# Patient Record
Sex: Female | Born: 1955
Health system: Southern US, Community
[De-identification: ages and names within clinical notes are randomized; demographics above are authoritative.]

## PROBLEM LIST (undated history)

## (undated) DIAGNOSIS — G4730 Sleep apnea, unspecified: Secondary | ICD-10-CM

## (undated) DIAGNOSIS — R519 Headache, unspecified: Secondary | ICD-10-CM

## (undated) DIAGNOSIS — R51 Headache: Secondary | ICD-10-CM

## (undated) DIAGNOSIS — G8929 Other chronic pain: Secondary | ICD-10-CM

## (undated) DIAGNOSIS — E785 Hyperlipidemia, unspecified: Secondary | ICD-10-CM

## (undated) DIAGNOSIS — C55 Malignant neoplasm of uterus, part unspecified: Secondary | ICD-10-CM

## (undated) HISTORY — DX: Sleep apnea, unspecified: G47.30

## (undated) HISTORY — PX: DILATION AND CURETTAGE OF UTERUS: SHX78

## (undated) HISTORY — PX: INCONTINENCE SURGERY: SHX676

## (undated) HISTORY — PX: MENISCUS REPAIR: SHX5179

## (undated) HISTORY — PX: LAPAROSCOPIC HYSTERECTOMY: SHX1926

## (undated) HISTORY — PX: EYE SURGERY: SHX253

## (undated) HISTORY — DX: Other chronic pain: G89.29

## (undated) HISTORY — DX: Headache, unspecified: R51.9

## (undated) HISTORY — DX: Hyperlipidemia, unspecified: E78.5

## (undated) HISTORY — PX: COLONOSCOPY: SHX174

## (undated) HISTORY — DX: Malignant neoplasm of uterus, part unspecified: C55

## (undated) HISTORY — PX: OTHER SURGICAL HISTORY: SHX169

## (undated) HISTORY — DX: Headache: R51

---

## 2000-04-13 ENCOUNTER — Encounter: Admission: RE | Admit: 2000-04-13 | Discharge: 2000-04-13 | Payer: Self-pay | Admitting: Family Medicine

## 2000-04-13 ENCOUNTER — Encounter: Payer: Self-pay | Admitting: Family Medicine

## 2000-04-21 ENCOUNTER — Other Ambulatory Visit: Admission: RE | Admit: 2000-04-21 | Discharge: 2000-04-21 | Payer: Self-pay | Admitting: Family Medicine

## 2001-09-27 ENCOUNTER — Other Ambulatory Visit: Admission: RE | Admit: 2001-09-27 | Discharge: 2001-09-27 | Payer: Self-pay | Admitting: Family Medicine

## 2001-10-15 ENCOUNTER — Encounter: Payer: Self-pay | Admitting: Family Medicine

## 2001-10-15 ENCOUNTER — Encounter: Admission: RE | Admit: 2001-10-15 | Discharge: 2001-10-15 | Payer: Self-pay | Admitting: Family Medicine

## 2001-12-31 ENCOUNTER — Other Ambulatory Visit: Admission: RE | Admit: 2001-12-31 | Discharge: 2001-12-31 | Payer: Self-pay | Admitting: Family Medicine

## 2002-09-15 ENCOUNTER — Other Ambulatory Visit: Admission: RE | Admit: 2002-09-15 | Discharge: 2002-09-15 | Payer: Self-pay | Admitting: Family Medicine

## 2003-03-30 ENCOUNTER — Other Ambulatory Visit: Admission: RE | Admit: 2003-03-30 | Discharge: 2003-03-30 | Payer: Self-pay | Admitting: Family Medicine

## 2003-03-31 ENCOUNTER — Encounter: Payer: Self-pay | Admitting: Family Medicine

## 2003-03-31 ENCOUNTER — Encounter: Admission: RE | Admit: 2003-03-31 | Discharge: 2003-03-31 | Payer: Self-pay | Admitting: Family Medicine

## 2004-04-16 ENCOUNTER — Other Ambulatory Visit: Admission: RE | Admit: 2004-04-16 | Discharge: 2004-04-16 | Payer: Self-pay | Admitting: Family Medicine

## 2004-04-23 ENCOUNTER — Encounter: Admission: RE | Admit: 2004-04-23 | Discharge: 2004-04-23 | Payer: Self-pay | Admitting: Family Medicine

## 2004-05-06 ENCOUNTER — Encounter: Admission: RE | Admit: 2004-05-06 | Discharge: 2004-05-06 | Payer: Self-pay | Admitting: Family Medicine

## 2005-02-07 ENCOUNTER — Ambulatory Visit: Payer: Self-pay | Admitting: Family Medicine

## 2005-02-07 ENCOUNTER — Other Ambulatory Visit: Admission: RE | Admit: 2005-02-07 | Discharge: 2005-02-07 | Payer: Self-pay | Admitting: Family Medicine

## 2005-05-13 ENCOUNTER — Ambulatory Visit (HOSPITAL_COMMUNITY): Admission: RE | Admit: 2005-05-13 | Discharge: 2005-05-13 | Payer: Self-pay | Admitting: Family Medicine

## 2005-05-13 ENCOUNTER — Ambulatory Visit: Payer: Self-pay | Admitting: Family Medicine

## 2006-04-07 ENCOUNTER — Ambulatory Visit: Payer: Self-pay | Admitting: Family Medicine

## 2006-04-30 ENCOUNTER — Ambulatory Visit: Payer: Self-pay | Admitting: Family Medicine

## 2006-05-29 ENCOUNTER — Ambulatory Visit (HOSPITAL_COMMUNITY): Admission: RE | Admit: 2006-05-29 | Discharge: 2006-05-29 | Payer: Self-pay | Admitting: Family Medicine

## 2006-09-18 ENCOUNTER — Encounter (INDEPENDENT_AMBULATORY_CARE_PROVIDER_SITE_OTHER): Payer: Self-pay | Admitting: *Deleted

## 2006-09-18 ENCOUNTER — Encounter: Payer: Self-pay | Admitting: Family Medicine

## 2006-09-18 ENCOUNTER — Other Ambulatory Visit: Admission: RE | Admit: 2006-09-18 | Discharge: 2006-09-18 | Payer: Self-pay | Admitting: Family Medicine

## 2006-09-18 ENCOUNTER — Ambulatory Visit: Payer: Self-pay | Admitting: Family Medicine

## 2006-09-18 LAB — CONVERTED CEMR LAB
ALT: 22 units/L (ref 0–40)
AST: 25 units/L (ref 0–37)
Albumin: 4.3 g/dL (ref 3.5–5.2)
BUN: 9 mg/dL (ref 6–23)
Basophils Absolute: 0 10*3/uL (ref 0.0–0.1)
Basophils Relative: 0.6 % (ref 0.0–1.0)
CO2: 26 meq/L (ref 19–32)
Chol/HDL Ratio, serum: 4.6
Cholesterol: 220 mg/dL (ref 0–200)
Eosinophil percent: 1.6 % (ref 0.0–5.0)
HCT: 43.2 % (ref 36.0–46.0)
MCHC: 32.7 g/dL (ref 30.0–36.0)
Monocytes Absolute: 0.4 10*3/uL (ref 0.2–0.7)
Platelets: 390 10*3/uL (ref 150–400)
RBC: 4.74 M/uL (ref 3.87–5.11)
RDW: 12.8 % (ref 11.5–14.6)
Total Protein: 7.1 g/dL (ref 6.0–8.3)
VLDL: 24 mg/dL (ref 0–40)

## 2007-05-21 ENCOUNTER — Ambulatory Visit: Payer: Self-pay | Admitting: Family Medicine

## 2007-05-21 DIAGNOSIS — E785 Hyperlipidemia, unspecified: Secondary | ICD-10-CM | POA: Insufficient documentation

## 2007-05-21 DIAGNOSIS — L650 Telogen effluvium: Secondary | ICD-10-CM | POA: Insufficient documentation

## 2007-05-31 LAB — CONVERTED CEMR LAB
Basophils Absolute: 0 10*3/uL (ref 0.0–0.1)
Cholesterol: 238 mg/dL (ref 0–200)
Direct LDL: 157.7 mg/dL
HCT: 39.9 % (ref 36.0–46.0)
Hemoglobin: 13.4 g/dL (ref 12.0–15.0)
Lymphocytes Relative: 36.5 % (ref 12.0–46.0)
MCHC: 33.7 g/dL (ref 30.0–36.0)
MCV: 89.6 fL (ref 78.0–100.0)
Monocytes Absolute: 0.4 10*3/uL (ref 0.2–0.7)
Monocytes Relative: 7.9 % (ref 3.0–11.0)
RDW: 13 % (ref 11.5–14.6)
Triglycerides: 97 mg/dL (ref 0–149)
VLDL: 19 mg/dL (ref 0–40)

## 2007-08-27 ENCOUNTER — Encounter: Admission: RE | Admit: 2007-08-27 | Discharge: 2007-08-27 | Payer: Self-pay | Admitting: Family Medicine

## 2007-08-27 ENCOUNTER — Ambulatory Visit: Payer: Self-pay | Admitting: Family Medicine

## 2007-08-31 ENCOUNTER — Encounter (INDEPENDENT_AMBULATORY_CARE_PROVIDER_SITE_OTHER): Payer: Self-pay | Admitting: *Deleted

## 2007-09-02 LAB — CONVERTED CEMR LAB
ALT: 30 units/L (ref 0–35)
AST: 25 units/L (ref 0–37)
Albumin: 3.9 g/dL (ref 3.5–5.2)
Alkaline Phosphatase: 64 units/L (ref 39–117)
Direct LDL: 160.1 mg/dL
Total Bilirubin: 1.1 mg/dL (ref 0.3–1.2)
Total CHOL/HDL Ratio: 4.3
Triglycerides: 99 mg/dL (ref 0–149)
VLDL: 20 mg/dL (ref 0–40)

## 2007-09-08 ENCOUNTER — Telehealth (INDEPENDENT_AMBULATORY_CARE_PROVIDER_SITE_OTHER): Payer: Self-pay | Admitting: *Deleted

## 2007-11-04 ENCOUNTER — Encounter: Payer: Self-pay | Admitting: Family Medicine

## 2007-11-04 ENCOUNTER — Other Ambulatory Visit: Admission: RE | Admit: 2007-11-04 | Discharge: 2007-11-04 | Payer: Self-pay | Admitting: Family Medicine

## 2007-11-04 ENCOUNTER — Encounter (INDEPENDENT_AMBULATORY_CARE_PROVIDER_SITE_OTHER): Payer: Self-pay | Admitting: *Deleted

## 2007-11-04 ENCOUNTER — Ambulatory Visit: Payer: Self-pay | Admitting: Family Medicine

## 2007-11-04 DIAGNOSIS — H519 Unspecified disorder of binocular movement: Secondary | ICD-10-CM | POA: Insufficient documentation

## 2007-11-04 DIAGNOSIS — R0789 Other chest pain: Secondary | ICD-10-CM

## 2007-11-04 DIAGNOSIS — Z8719 Personal history of other diseases of the digestive system: Secondary | ICD-10-CM | POA: Insufficient documentation

## 2007-11-08 ENCOUNTER — Encounter (INDEPENDENT_AMBULATORY_CARE_PROVIDER_SITE_OTHER): Payer: Self-pay | Admitting: *Deleted

## 2007-11-10 ENCOUNTER — Encounter (INDEPENDENT_AMBULATORY_CARE_PROVIDER_SITE_OTHER): Payer: Self-pay | Admitting: *Deleted

## 2007-11-11 ENCOUNTER — Encounter (INDEPENDENT_AMBULATORY_CARE_PROVIDER_SITE_OTHER): Payer: Self-pay | Admitting: *Deleted

## 2007-11-23 ENCOUNTER — Encounter: Payer: Self-pay | Admitting: Family Medicine

## 2007-11-23 ENCOUNTER — Ambulatory Visit: Payer: Self-pay

## 2007-12-07 ENCOUNTER — Encounter (INDEPENDENT_AMBULATORY_CARE_PROVIDER_SITE_OTHER): Payer: Self-pay | Admitting: *Deleted

## 2008-02-07 ENCOUNTER — Ambulatory Visit: Payer: Self-pay | Admitting: Family Medicine

## 2008-02-08 LAB — CONVERTED CEMR LAB
ALT: 21 units/L (ref 0–35)
Alkaline Phosphatase: 46 units/L (ref 39–117)
Bilirubin, Direct: 0.1 mg/dL (ref 0.0–0.3)
Cholesterol: 176 mg/dL (ref 0–200)
Total Bilirubin: 0.6 mg/dL (ref 0.3–1.2)
Total CHOL/HDL Ratio: 2.8
Triglycerides: 112 mg/dL (ref 0–149)

## 2008-03-22 ENCOUNTER — Telehealth (INDEPENDENT_AMBULATORY_CARE_PROVIDER_SITE_OTHER): Payer: Self-pay | Admitting: *Deleted

## 2008-08-02 ENCOUNTER — Ambulatory Visit: Payer: Self-pay | Admitting: Family Medicine

## 2008-08-10 ENCOUNTER — Encounter (INDEPENDENT_AMBULATORY_CARE_PROVIDER_SITE_OTHER): Payer: Self-pay | Admitting: *Deleted

## 2008-08-10 LAB — CONVERTED CEMR LAB
ALT: 21 units/L (ref 0–35)
HDL: 42.6 mg/dL (ref >39.0)
LDL Cholesterol: 112 mg/dL — ABNORMAL HIGH (ref 0–99)
Triglycerides: 75 mg/dL (ref 0–149)
VLDL: 15 mg/dL (ref 0–40)

## 2008-09-20 ENCOUNTER — Ambulatory Visit (HOSPITAL_COMMUNITY): Admission: RE | Admit: 2008-09-20 | Discharge: 2008-09-20 | Payer: Self-pay | Admitting: Family Medicine

## 2008-09-27 ENCOUNTER — Encounter (INDEPENDENT_AMBULATORY_CARE_PROVIDER_SITE_OTHER): Payer: Self-pay | Admitting: *Deleted

## 2008-10-13 ENCOUNTER — Ambulatory Visit: Payer: Self-pay | Admitting: Family Medicine

## 2008-10-13 ENCOUNTER — Other Ambulatory Visit: Admission: RE | Admit: 2008-10-13 | Discharge: 2008-10-13 | Payer: Self-pay | Admitting: Family Medicine

## 2008-10-13 ENCOUNTER — Encounter: Payer: Self-pay | Admitting: Family Medicine

## 2008-10-13 DIAGNOSIS — I10 Essential (primary) hypertension: Secondary | ICD-10-CM | POA: Insufficient documentation

## 2008-10-16 ENCOUNTER — Encounter (INDEPENDENT_AMBULATORY_CARE_PROVIDER_SITE_OTHER): Payer: Self-pay | Admitting: *Deleted

## 2008-10-18 ENCOUNTER — Encounter (INDEPENDENT_AMBULATORY_CARE_PROVIDER_SITE_OTHER): Payer: Self-pay | Admitting: *Deleted

## 2008-10-30 ENCOUNTER — Ambulatory Visit: Payer: Self-pay

## 2008-10-30 ENCOUNTER — Encounter: Payer: Self-pay | Admitting: Family Medicine

## 2008-11-06 ENCOUNTER — Telehealth: Payer: Self-pay | Admitting: Family Medicine

## 2008-11-08 ENCOUNTER — Encounter (INDEPENDENT_AMBULATORY_CARE_PROVIDER_SITE_OTHER): Payer: Self-pay | Admitting: *Deleted

## 2008-12-01 DIAGNOSIS — C55 Malignant neoplasm of uterus, part unspecified: Secondary | ICD-10-CM

## 2008-12-01 HISTORY — DX: Malignant neoplasm of uterus, part unspecified: C55

## 2009-03-15 ENCOUNTER — Ambulatory Visit: Payer: Self-pay | Admitting: Family Medicine

## 2009-03-15 DIAGNOSIS — R5381 Other malaise: Secondary | ICD-10-CM | POA: Insufficient documentation

## 2009-03-15 DIAGNOSIS — R310 Gross hematuria: Secondary | ICD-10-CM | POA: Insufficient documentation

## 2009-03-15 DIAGNOSIS — R5383 Other fatigue: Secondary | ICD-10-CM

## 2009-03-15 DIAGNOSIS — M2550 Pain in unspecified joint: Secondary | ICD-10-CM | POA: Insufficient documentation

## 2009-03-15 DIAGNOSIS — Z87448 Personal history of other diseases of urinary system: Secondary | ICD-10-CM | POA: Insufficient documentation

## 2009-03-15 LAB — CONVERTED CEMR LAB
Specific Gravity, Urine: <1.005
Urobilinogen, UA: NEGATIVE
WBC Urine, dipstick: NEGATIVE

## 2009-03-16 ENCOUNTER — Ambulatory Visit: Payer: Self-pay | Admitting: Family Medicine

## 2009-03-16 LAB — CONVERTED CEMR LAB
RBC / HPF: NONE SEEN (ref <3)
WBC, UA: NONE SEEN cells/hpf (ref <3)

## 2009-03-19 ENCOUNTER — Encounter (INDEPENDENT_AMBULATORY_CARE_PROVIDER_SITE_OTHER): Payer: Self-pay | Admitting: *Deleted

## 2009-03-19 ENCOUNTER — Telehealth (INDEPENDENT_AMBULATORY_CARE_PROVIDER_SITE_OTHER): Payer: Self-pay | Admitting: *Deleted

## 2009-03-22 LAB — CONVERTED CEMR LAB: Anti Nuclear Antibody(ANA): NEGATIVE

## 2009-03-23 ENCOUNTER — Encounter (INDEPENDENT_AMBULATORY_CARE_PROVIDER_SITE_OTHER): Payer: Self-pay | Admitting: *Deleted

## 2009-03-25 LAB — CONVERTED CEMR LAB
Albumin: 4.1 g/dL (ref 3.5–5.2)
Basophils Absolute: 0 10*3/uL (ref 0.0–0.1)
CO2: 29 meq/L (ref 19–32)
Calcium: 9.5 mg/dL (ref 8.4–10.5)
Chloride: 106 meq/L (ref 96–112)
Cholesterol: 252 mg/dL — ABNORMAL HIGH (ref 0–200)
Creatinine, Ser: 0.8 mg/dL (ref 0.4–1.2)
Eosinophils Absolute: 0.3 10*3/uL (ref 0.0–0.7)
HDL: 55.7 mg/dL (ref >39.00)
Hemoglobin: 14 g/dL (ref 12.0–15.0)
MCV: 88.7 fL (ref 78.0–100.0)
Monocytes Relative: 8.1 % (ref 3.0–12.0)
Neutro Abs: 3 10*3/uL (ref 1.4–7.7)
Potassium: 4.1 meq/L (ref 3.5–5.1)
Total CHOL/HDL Ratio: 5
Total Protein: 7 g/dL (ref 6.0–8.3)
Triglycerides: 135 mg/dL (ref 0.0–149.0)

## 2009-03-26 ENCOUNTER — Encounter (INDEPENDENT_AMBULATORY_CARE_PROVIDER_SITE_OTHER): Payer: Self-pay | Admitting: *Deleted

## 2009-04-04 ENCOUNTER — Telehealth (INDEPENDENT_AMBULATORY_CARE_PROVIDER_SITE_OTHER): Payer: Self-pay | Admitting: *Deleted

## 2009-04-20 ENCOUNTER — Telehealth: Payer: Self-pay | Admitting: Family Medicine

## 2009-04-24 ENCOUNTER — Telehealth (INDEPENDENT_AMBULATORY_CARE_PROVIDER_SITE_OTHER): Payer: Self-pay | Admitting: *Deleted

## 2009-05-04 ENCOUNTER — Ambulatory Visit: Payer: Self-pay | Admitting: Family Medicine

## 2009-05-07 ENCOUNTER — Ambulatory Visit: Payer: Self-pay | Admitting: Family Medicine

## 2009-05-09 ENCOUNTER — Telehealth (INDEPENDENT_AMBULATORY_CARE_PROVIDER_SITE_OTHER): Payer: Self-pay | Admitting: *Deleted

## 2009-05-13 LAB — CONVERTED CEMR LAB
Alkaline Phosphatase: 56 units/L (ref 39–117)
Chloride: 108 meq/L (ref 96–112)
Cholesterol: 146 mg/dL (ref 0–200)
Creatinine, Ser: 0.8 mg/dL (ref 0.4–1.2)
GFR calc non Af Amer: 79.11 mL/min (ref >60)
Glucose, Bld: 102 mg/dL — ABNORMAL HIGH (ref 70–99)
HDL: 57.5 mg/dL (ref >39.00)
LDL Cholesterol: 72 mg/dL (ref 0–99)
Triglycerides: 83 mg/dL (ref 0.0–149.0)
VLDL: 16.6 mg/dL (ref 0.0–40.0)

## 2009-05-14 ENCOUNTER — Encounter (INDEPENDENT_AMBULATORY_CARE_PROVIDER_SITE_OTHER): Payer: Self-pay | Admitting: *Deleted

## 2009-09-26 ENCOUNTER — Ambulatory Visit (HOSPITAL_BASED_OUTPATIENT_CLINIC_OR_DEPARTMENT_OTHER): Admission: RE | Admit: 2009-09-26 | Discharge: 2009-09-26 | Payer: Self-pay | Admitting: Obstetrics and Gynecology

## 2009-09-26 ENCOUNTER — Encounter (INDEPENDENT_AMBULATORY_CARE_PROVIDER_SITE_OTHER): Payer: Self-pay | Admitting: Obstetrics and Gynecology

## 2009-10-05 ENCOUNTER — Encounter: Payer: Self-pay | Admitting: Family Medicine

## 2009-10-17 ENCOUNTER — Ambulatory Visit (HOSPITAL_COMMUNITY): Admission: RE | Admit: 2009-10-17 | Discharge: 2009-10-17 | Payer: Self-pay | Admitting: Obstetrics and Gynecology

## 2009-11-05 ENCOUNTER — Encounter: Payer: Self-pay | Admitting: Family Medicine

## 2009-12-10 ENCOUNTER — Encounter: Payer: Self-pay | Admitting: Family Medicine

## 2009-12-19 ENCOUNTER — Encounter: Payer: Self-pay | Admitting: Family Medicine

## 2010-01-09 ENCOUNTER — Encounter: Payer: Self-pay | Admitting: Family Medicine

## 2010-02-11 ENCOUNTER — Encounter: Payer: Self-pay | Admitting: Family Medicine

## 2010-03-13 ENCOUNTER — Encounter: Payer: Self-pay | Admitting: Family Medicine

## 2010-08-13 ENCOUNTER — Ambulatory Visit (HOSPITAL_COMMUNITY): Admission: RE | Admit: 2010-08-13 | Discharge: 2010-08-13 | Payer: Self-pay | Admitting: Obstetrics and Gynecology

## 2010-10-28 ENCOUNTER — Ambulatory Visit (HOSPITAL_COMMUNITY): Admission: RE | Admit: 2010-10-28 | Discharge: 2010-10-28 | Payer: Self-pay | Admitting: Internal Medicine

## 2010-12-29 LAB — CONVERTED CEMR LAB
ALT: 19 units/L (ref 0–35)
ALT: 20 units/L (ref 0–35)
AST: 19 units/L (ref 0–37)
AST: 22 units/L (ref 0–37)
Alkaline Phosphatase: 64 units/L (ref 39–117)
Basophils Relative: 0.5 % (ref 0.0–1.0)
Basophils Relative: 0.6 % (ref 0.0–3.0)
Bilirubin Urine: NEGATIVE
Bilirubin Urine: NEGATIVE
Bilirubin, Direct: 0.1 mg/dL (ref 0.0–0.3)
Blood in Urine, dipstick: NEGATIVE
Calcium: 8.5 mg/dL (ref 8.4–10.5)
Calcium: 9.1 mg/dL (ref 8.4–10.5)
Chloride: 104 meq/L (ref 96–112)
Cholesterol: 193 mg/dL (ref 0–200)
Cholesterol: 228 mg/dL (ref 0–200)
Eosinophils Absolute: 0.1 10*3/uL (ref 0.0–0.7)
Eosinophils Relative: 2.4 % (ref 0.0–5.0)
GFR calc non Af Amer: 79 mL/min
Glucose, Bld: 97 mg/dL (ref 70–99)
HCT: 40.8 % (ref 36.0–46.0)
HDL: 56.3 mg/dL (ref >39.0)
Hemoglobin: 14.2 g/dL (ref 12.0–15.0)
Ketones, urine, test strip: NEGATIVE
Lymphocytes Relative: 28.2 % (ref 12.0–46.0)
MCHC: 34.6 g/dL (ref 30.0–36.0)
MCHC: 34.7 g/dL (ref 30.0–36.0)
MCV: 88.4 fL (ref 78.0–100.0)
Monocytes Absolute: 0.5 10*3/uL (ref 0.1–1.0)
Monocytes Absolute: 0.5 10*3/uL (ref 0.2–0.7)
Monocytes Relative: 7.9 % (ref 3.0–12.0)
Neutro Abs: 3.5 10*3/uL (ref 1.4–7.7)
Neutrophils Relative %: 60.9 % (ref 43.0–77.0)
Neutrophils Relative %: 69 % (ref 43.0–77.0)
Nitrite: NEGATIVE
Platelets: 306 10*3/uL (ref 150–400)
Platelets: 366 10*3/uL (ref 150–400)
Potassium: 3.7 meq/L (ref 3.5–5.1)
RBC: 4.54 M/uL (ref 3.87–5.11)
RDW: 13 % (ref 11.5–14.6)
Specific Gravity, Urine: 1.025
Total Bilirubin: 0.7 mg/dL (ref 0.3–1.2)
Total Bilirubin: 0.8 mg/dL (ref 0.3–1.2)
Total Protein: 6.7 g/dL (ref 6.0–8.3)
Triglycerides: 124 mg/dL (ref 0–149)
Urobilinogen, UA: NEGATIVE
Urobilinogen, UA: NEGATIVE

## 2010-12-31 NOTE — Letter (Signed)
Summary: [facility]  [facility]   Imported By: Lanelle Bal 04/03/2010 08:39:12  _____________________________________________________________________  External Attachment:    Type:   Image     Comment:   External Document

## 2010-12-31 NOTE — Letter (Signed)
Summary: [facility]  [facility]   Imported By: Lanelle Bal 03/21/2010 11:16:48  _____________________________________________________________________  External Attachment:    Type:   Image     Comment:   External Document

## 2010-12-31 NOTE — Letter (Signed)
Summary: [facility]  [facility]   Imported By: Lanelle Bal 01/02/2010 12:07:27  _____________________________________________________________________  External Attachment:    Type:   Image     Comment:   External Document

## 2010-12-31 NOTE — Letter (Signed)
Summary: [facility]  [facility]   Imported By: Lanelle Bal 03/15/2010 09:03:43  _____________________________________________________________________  External Attachment:    Type:   Image     Comment:   External Document

## 2010-12-31 NOTE — Letter (Signed)
Summary: [facility]  [facility]   Imported By: Lanelle Bal 01/07/2010 08:21:19  _____________________________________________________________________  External Attachment:    Type:   Image     Comment:   External Document

## 2010-12-31 NOTE — Letter (Signed)
Summary: [facility]  [facility]   Imported By: Lanelle Bal 01/17/2010 13:40:31  _____________________________________________________________________  External Attachment:    Type:   Image     Comment:   External Document

## 2011-01-29 ENCOUNTER — Other Ambulatory Visit: Payer: Self-pay | Admitting: Obstetrics and Gynecology

## 2011-03-06 LAB — WOUND CULTURE

## 2011-03-06 LAB — ANAEROBIC CULTURE

## 2011-03-06 LAB — POCT HEMOGLOBIN-HEMACUE: Hemoglobin: 13.3 g/dL (ref 12.0–15.0)

## 2011-06-24 ENCOUNTER — Encounter: Payer: Self-pay | Admitting: Gastroenterology

## 2011-07-23 ENCOUNTER — Other Ambulatory Visit: Payer: Self-pay | Admitting: Internal Medicine

## 2011-07-23 ENCOUNTER — Ambulatory Visit
Admission: RE | Admit: 2011-07-23 | Discharge: 2011-07-23 | Disposition: A | Discharge status: Home / Self Care | Payer: 59 | Source: Ambulatory Visit | Attending: Internal Medicine | Admitting: Internal Medicine

## 2011-07-23 DIAGNOSIS — R0602 Shortness of breath: Secondary | ICD-10-CM

## 2011-09-23 ENCOUNTER — Other Ambulatory Visit (HOSPITAL_COMMUNITY): Payer: Self-pay | Admitting: Family Medicine

## 2011-09-23 DIAGNOSIS — Z1231 Encounter for screening mammogram for malignant neoplasm of breast: Secondary | ICD-10-CM

## 2011-11-06 ENCOUNTER — Ambulatory Visit (HOSPITAL_COMMUNITY): Payer: 59

## 2011-12-10 ENCOUNTER — Ambulatory Visit (HOSPITAL_COMMUNITY)
Admission: RE | Admit: 2011-12-10 | Discharge: 2011-12-10 | Disposition: A | Discharge status: Home / Self Care | Payer: 59 | Source: Ambulatory Visit | Attending: Family Medicine | Admitting: Family Medicine

## 2011-12-10 DIAGNOSIS — Z1231 Encounter for screening mammogram for malignant neoplasm of breast: Secondary | ICD-10-CM

## 2012-12-13 ENCOUNTER — Other Ambulatory Visit (HOSPITAL_COMMUNITY): Payer: Self-pay | Admitting: Family Medicine

## 2012-12-13 DIAGNOSIS — Z1231 Encounter for screening mammogram for malignant neoplasm of breast: Secondary | ICD-10-CM

## 2012-12-30 ENCOUNTER — Ambulatory Visit (HOSPITAL_COMMUNITY): Payer: 59

## 2013-01-07 ENCOUNTER — Ambulatory Visit (HOSPITAL_COMMUNITY)
Admission: RE | Admit: 2013-01-07 | Discharge: 2013-01-07 | Disposition: A | Discharge status: Home / Self Care | Payer: 59 | Source: Ambulatory Visit | Attending: Family Medicine | Admitting: Family Medicine

## 2013-01-07 DIAGNOSIS — Z1231 Encounter for screening mammogram for malignant neoplasm of breast: Secondary | ICD-10-CM | POA: Insufficient documentation

## 2013-01-10 ENCOUNTER — Other Ambulatory Visit: Payer: Self-pay | Admitting: Family Medicine

## 2013-01-10 DIAGNOSIS — R928 Other abnormal and inconclusive findings on diagnostic imaging of breast: Secondary | ICD-10-CM

## 2013-01-19 ENCOUNTER — Ambulatory Visit
Admission: RE | Admit: 2013-01-19 | Discharge: 2013-01-19 | Disposition: A | Discharge status: Home / Self Care | Payer: Managed Care, Other (non HMO) | Source: Ambulatory Visit | Attending: Family Medicine | Admitting: Family Medicine

## 2013-01-19 ENCOUNTER — Telehealth: Payer: Self-pay | Admitting: Gastroenterology

## 2013-01-19 DIAGNOSIS — R928 Other abnormal and inconclusive findings on diagnostic imaging of breast: Secondary | ICD-10-CM

## 2013-01-19 NOTE — Telephone Encounter (Signed)
Pt is not due for colon recall until June of 2015. Pt states that she has noticed that she is having decreased feeling in her rectum, not good sphincter control. States this often leads to constipation. Pt also states that when she has diarrhea she cannot stop the stool from coming out. Dr. Arlyce Dice please advise.

## 2013-01-19 NOTE — Telephone Encounter (Signed)
Pt scheduled to see Dr. Arlyce Dice 01/24/13@9 :30am. Pt aware of appt date and time.

## 2013-01-19 NOTE — Telephone Encounter (Signed)
Needs OV.  

## 2013-01-24 ENCOUNTER — Ambulatory Visit (INDEPENDENT_AMBULATORY_CARE_PROVIDER_SITE_OTHER): Payer: Managed Care, Other (non HMO) | Admitting: Gastroenterology

## 2013-01-24 ENCOUNTER — Encounter: Payer: Self-pay | Admitting: Gastroenterology

## 2013-01-24 VITALS — BP 142/84 | HR 64 | Ht 66.0 in | Wt 187.0 lb

## 2013-01-24 DIAGNOSIS — R159 Full incontinence of feces: Secondary | ICD-10-CM

## 2013-01-24 DIAGNOSIS — K5900 Constipation, unspecified: Secondary | ICD-10-CM

## 2013-01-24 NOTE — Assessment & Plan Note (Signed)
Patient has had a notable change in bowel habits. Etiology is uncertain. A structural abnormality the colon should be ruled out.  Recommendations #1 colonoscopy #2 further recommendations on a bowel regimen pending results of colonoscopy

## 2013-01-24 NOTE — Patient Instructions (Addendum)
You have been scheduled for a colonoscopy with propofol. Please follow written instructions given to you at your visit today.  Please pick up your prep kit at the pharmacy within the next 1-3 days. If you use inhalers (even only as needed) or a CPAP machine, please bring them with you on the day of your procedure.  

## 2013-01-24 NOTE — Progress Notes (Signed)
History of Present Illness: 57 year old white female referred at the request of Dr. Katrinka Blazing for evaluation of change in bowel habits and stool incontinence. Over the past year she's developed constipation. She has no sensation of having to move her bowels for at least a week a time. She does not let herself go more than 5 days without a stimulant. She will forcibly have a bowel movement. Very intermittently she has severe diarrhea with urgency and incontinence. This seems to be triggered by certain foods although she's not aware of any specific pattern. There is no history of melena or hematochezia. Colonoscopy about 10 years ago apparently was normal. There's been no change in her medications. She underwent hysterectomy several years ago followed by radiation therapy for uterine cancer.    Past Medical History  Diagnosis Date  . Uterine cancer   . Chronic headaches   . HLD (hyperlipidemia)   . Sleep apnea    Past Surgical History  Procedure Laterality Date  . Laparoscopic hysterectomy    . Eye surgery      crossed eyes  . Meniscus repair Right   . Incontinence surgery    . Dilation and curettage of uterus     family history includes Cancer in her maternal grandfather; Depression in her mother; Heart attack in her maternal aunt; Hypertension in her father and mother; and Kidney failure in her father. Current Outpatient Prescriptions  Medication Sig Dispense Refill  . aspirin 325 MG tablet Take 325 mg by mouth daily.      Marland Kitchen atorvastatin (LIPITOR) 20 MG tablet Take 20 mg by mouth daily.      . Multiple Vitamins-Minerals (CENTRUM PO) Take 1 tablet by mouth daily.      . solifenacin (VESICARE) 10 MG tablet Take 10 mg by mouth daily.       No current facility-administered medications for this visit.   Allergies as of 01/24/2013  . (No Known Allergies)    reports that she has never smoked. She has never used smokeless tobacco. She reports that  drinks alcohol. She reports that she does not  use illicit drugs.     Review of Systems: Pertinent positive and negative review of systems were noted in the above HPI section. All other review of systems were otherwise negative.  Vital signs were reviewed in today's medical record Physical Exam: General: Well developed , well nourished, no acute distress Skin: anicteric Head: Normocephalic and atraumatic Eyes:  sclerae anicteric, EOMI Ears: Normal auditory acuity Mouth: No deformity or lesions Neck: Supple, no masses or thyromegaly Lungs: Clear throughout to auscultation Heart: Regular rate and rhythm; no murmurs, rubs or bruits Abdomen: Soft, non tender and non distended. No masses, hepatosplenomegaly or hernias noted. Normal Bowel sounds Rectal: There no rectal masses. Sphincter tone is normal. Stools Hemoccult negative Musculoskeletal: Symmetrical with no gross deformities  Skin: No lesions on visible extremities Pulses:  Normal pulses noted Extremities: No clubbing, cyanosis, edema or deformities noted Neurological: Alert oriented x 4, grossly nonfocal Cervical Nodes:  No significant cervical adenopathy Inguinal Nodes: No significant inguinal adenopathy Psychological:  Alert and cooperative. Normal mood and affect

## 2013-01-24 NOTE — Assessment & Plan Note (Signed)
The patient is actually having diarrhea with severe urgency and incontinence. I believe that the primary issue is a trigger for the diarrhea. I suspect it is certain foodstuffs.  Diarrhea is probably exacerbated by her severe constipation and obstipation.  Recommendations #1 I instructed the patient to try to take a dietary history when she has episodes of severe diarrhea and incontinence.

## 2013-02-10 ENCOUNTER — Encounter: Payer: Self-pay | Admitting: Gastroenterology

## 2013-02-10 ENCOUNTER — Ambulatory Visit (AMBULATORY_SURGERY_CENTER): Payer: Commercial Indemnity | Admitting: Gastroenterology

## 2013-02-10 VITALS — BP 162/80 | HR 67 | Temp 99.0°F | Resp 15 | Ht 66.0 in | Wt 187.0 lb

## 2013-02-10 DIAGNOSIS — K5900 Constipation, unspecified: Secondary | ICD-10-CM

## 2013-02-10 DIAGNOSIS — R159 Full incontinence of feces: Secondary | ICD-10-CM

## 2013-02-10 DIAGNOSIS — D126 Benign neoplasm of colon, unspecified: Secondary | ICD-10-CM

## 2013-02-10 MED ORDER — SODIUM CHLORIDE 0.9 % IV SOLN: 500.0000 mL | INTRAVENOUS | Status: DC

## 2013-02-10 NOTE — Progress Notes (Signed)
Called to room to assist during endoscopic procedure.  Patient ID and intended procedure confirmed with present staff. Received instructions for my participation in the procedure from the performing physician.  

## 2013-02-10 NOTE — Op Note (Signed)
[facility] [address] [place], [address]   COLONOSCOPY PROCEDURE REPORT  PATIENT: Erika Jones, Erika Jones  MR#: [id number] BIRTHDATE: Feb 03, 1956 , 56  yrs. old GENDER: Female ENDOSCOPIST: Louis Meckel, MD REFERRED [id number] Katrinka Blazing, M.D. PROCEDURE DATE:  02/10/2013 PROCEDURE:   Colonoscopy with snare polypectomy ASA CLASS:   Class II INDICATIONS:average risk screening and Constipation. MEDICATIONS: MAC sedation, administered by CRNA and Propofol (Diprivan) 170 mg IV  DESCRIPTION OF PROCEDURE:   After the risks benefits and alternatives of the procedure were thoroughly explained, informed consent was obtained.  A digital rectal exam revealed no abnormalities of the rectum.   The LB [id number]  endoscope was introduced through the anus and advanced to the cecum, which was identified by both the appendix and ileocecal valve. No adverse events experienced.   The quality of the prep was Suprep good  The instrument was then slowly withdrawn as the colon was fully examined.      COLON FINDINGS: A sessile polyp measuring 3 mm in size was found in the transverse colon.  A polypectomy was performed with a cold snare.  The resection was complete and the polyp tissue was completely retrieved.   The colon mucosa was otherwise normal. Retroflexed views revealed no abnormalities. The time to cecum=1 minutes 49 seconds.  Withdrawal time=7 minutes 16 seconds.  The scope was withdrawn and the procedure completed. COMPLICATIONS: There were no complications.  ENDOSCOPIC IMPRESSION: 1.   Sessile polyp measuring 3 mm in size was found in the transverse colon; polypectomy was performed with a cold snare 2.   The colon mucosa was otherwise normal  RECOMMENDATIONS: If the polyp(s) removed today are proven to be adenomatous (pre-cancerous) polyps, you will need a repeat colonoscopy in 5 years.  Otherwise you should continue to follow colorectal cancer screening  guidelines for "routine risk" patients with colonoscopy in 10 years.  You will receive a letter within 1-2 weeks with the results of your biopsy as well as final recommendations.  Please call my office if you have not received a letter after 3 weeks. Trial of Linzess   eSigned:  Louis Meckel, MD 02/10/2013 3:35 PM   cc:

## 2013-02-10 NOTE — Patient Instructions (Addendum)
Call back in one week to report progress with constipation  YOU HAD AN ENDOSCOPIC PROCEDURE TODAY AT THE [facility]: Refer to the procedure report that was given to you for any specific questions about what was found during the examination.  If the procedure report does not answer your questions, please call your gastroenterologist to clarify.  If you requested that your care partner not be given the details of your procedure findings, then the procedure report has been included in a sealed envelope for you to review at your convenience later.  YOU SHOULD EXPECT: Some feelings of bloating in the abdomen. Passage of more gas than usual.  Walking can help get rid of the air that was put into your GI tract during the procedure and reduce the bloating. If you had a lower endoscopy (such as a colonoscopy or flexible sigmoidoscopy) you may notice spotting of blood in your stool or on the toilet paper. If you underwent a bowel prep for your procedure, then you may not have a normal bowel movement for a few days.  DIET: Your first meal following the procedure should be a light meal and then it is ok to progress to your normal diet.  A half-sandwich or bowl of soup is an example of a good first meal.  Heavy or fried foods are harder to digest and may make you feel nauseous or bloated.  Likewise meals heavy in dairy and vegetables can cause extra gas to form and this can also increase the bloating.  Drink plenty of fluids but you should avoid alcoholic beverages for 24 hours.  ACTIVITY: Your care partner should take you home directly after the procedure.  You should plan to take it easy, moving slowly for the rest of the day.  You can resume normal activity the day after the procedure however you should NOT DRIVE or use heavy machinery for 24 hours (because of the sedation medicines used during the test).    SYMPTOMS TO REPORT IMMEDIATELY: A gastroenterologist can be reached at any hour.  During  normal business hours, 8:30 AM to 5:00 PM Monday through Friday, call [phone].  After hours and on weekends, please call the GI answering service at [phone] who will take a message and have the physician on call contact you.   Following lower endoscopy (colonoscopy or flexible sigmoidoscopy):  Excessive amounts of blood in the stool  Significant tenderness or worsening of abdominal pains  Swelling of the abdomen that is new, acute  Fever of 100F or higher  FOLLOW UP: If any biopsies were taken you will be contacted by phone or by letter within the next 1-3 weeks.  Call your gastroenterologist if you have not heard about the biopsies in 3 weeks.  Our staff will call the home number listed on your records the next business day following your procedure to check on you and address any questions or concerns that you may have at that time regarding the information given to you following your procedure. This is a courtesy call and so if there is no answer at the home number and we have not heard from you through the emergency physician on call, we will assume that you have returned to your regular daily activities without incident.  SIGNATURES/CONFIDENTIALITY: You and/or your care partner have signed paperwork which will be entered into your electronic medical record.  These signatures attest to the fact that that the information above on your After Visit Summary has been reviewed  and is understood.  Full responsibility of the confidentiality of this discharge information lies with you and/or your care-partner.  Polyps-handout given  Repeat colonoscopy will be determined by pathology  Linzess samples given

## 2013-02-10 NOTE — Progress Notes (Signed)
Patient did not experience any of the following events: a burn prior to discharge; a fall within the facility; wrong site/side/patient/procedure/implant event; or a hospital transfer or hospital admission upon discharge from the facility. (G8907) Patient did not have preoperative order for IV antibiotic SSI prophylaxis. (G8918)  

## 2013-02-10 NOTE — Progress Notes (Signed)
Report to pacu rn, vss, bbs=clear 

## 2013-02-11 ENCOUNTER — Telehealth: Payer: Self-pay | Admitting: *Deleted

## 2013-02-11 NOTE — Telephone Encounter (Signed)
  Follow up Call-  Call back number 02/10/2013  Post procedure Call Back phone  # [phone] hm  Permission to leave phone message Yes     Patient questions:  Do you have a fever, pain , or abdominal swelling? no Pain Score  0 *  Have you tolerated food without any problems? yes  Have you been able to return to your normal activities? yes  Do you have any questions about your discharge instructions: Diet   no Medications  no Follow up visit  no  Do you have questions or concerns about your Care? no  Actions: * If pain score is 4 or above: No action needed, pain <4. Pt states she did fine, went and played bonko last night even. No problems. ewm

## 2013-02-17 ENCOUNTER — Encounter: Payer: Self-pay | Admitting: Gastroenterology

## 2013-03-23 ENCOUNTER — Telehealth: Payer: Self-pay | Admitting: Gastroenterology

## 2013-03-23 MED ORDER — LINACLOTIDE 145 MCG PO CAPS: 145.0000 ug | ORAL_CAPSULE | Freq: Every day | ORAL | Status: AC

## 2013-03-23 NOTE — Telephone Encounter (Signed)
;  left message for pt that [facility]

## 2013-05-25 ENCOUNTER — Other Ambulatory Visit: Payer: Self-pay | Admitting: Orthopedic Surgery

## 2013-05-25 DIAGNOSIS — M25561 Pain in right knee: Secondary | ICD-10-CM

## 2013-05-29 ENCOUNTER — Ambulatory Visit
Admission: RE | Admit: 2013-05-29 | Discharge: 2013-05-29 | Disposition: A | Discharge status: Home / Self Care | Payer: Managed Care, Other (non HMO) | Source: Ambulatory Visit | Attending: Orthopedic Surgery | Admitting: Orthopedic Surgery

## 2013-05-29 DIAGNOSIS — M25561 Pain in right knee: Secondary | ICD-10-CM

## 2013-08-02 ENCOUNTER — Other Ambulatory Visit: Payer: Self-pay | Admitting: Family Medicine

## 2013-08-02 DIAGNOSIS — R921 Mammographic calcification found on diagnostic imaging of breast: Secondary | ICD-10-CM

## 2013-08-18 ENCOUNTER — Ambulatory Visit
Admission: RE | Admit: 2013-08-18 | Discharge: 2013-08-18 | Disposition: A | Discharge status: Home / Self Care | Payer: Commercial Indemnity | Source: Ambulatory Visit | Attending: Family Medicine | Admitting: Family Medicine

## 2013-08-18 DIAGNOSIS — R921 Mammographic calcification found on diagnostic imaging of breast: Secondary | ICD-10-CM

## 2014-01-10 ENCOUNTER — Other Ambulatory Visit: Payer: Self-pay | Admitting: Family Medicine

## 2014-01-10 DIAGNOSIS — R921 Mammographic calcification found on diagnostic imaging of breast: Secondary | ICD-10-CM

## 2014-01-23 ENCOUNTER — Other Ambulatory Visit: Payer: Self-pay | Admitting: Family Medicine

## 2014-01-23 DIAGNOSIS — R921 Mammographic calcification found on diagnostic imaging of breast: Secondary | ICD-10-CM

## 2014-02-20 ENCOUNTER — Ambulatory Visit
Admission: RE | Admit: 2014-02-20 | Discharge: 2014-02-20 | Disposition: A | Discharge status: Home / Self Care | Payer: Commercial Indemnity | Source: Ambulatory Visit | Attending: Family Medicine | Admitting: Family Medicine

## 2014-02-20 DIAGNOSIS — R921 Mammographic calcification found on diagnostic imaging of breast: Secondary | ICD-10-CM

## 2015-01-18 ENCOUNTER — Other Ambulatory Visit: Payer: Self-pay

## 2015-01-18 DIAGNOSIS — Z1231 Encounter for screening mammogram for malignant neoplasm of breast: Secondary | ICD-10-CM

## 2015-02-23 ENCOUNTER — Ambulatory Visit
Admission: RE | Admit: 2015-02-23 | Discharge: 2015-02-23 | Disposition: A | Discharge status: Home / Self Care | Payer: Commercial Indemnity | Source: Ambulatory Visit

## 2015-02-23 ENCOUNTER — Ambulatory Visit: Payer: Commercial Indemnity

## 2015-02-23 DIAGNOSIS — Z1231 Encounter for screening mammogram for malignant neoplasm of breast: Secondary | ICD-10-CM

## 2016-01-15 ENCOUNTER — Other Ambulatory Visit: Payer: Self-pay

## 2016-01-15 DIAGNOSIS — Z1231 Encounter for screening mammogram for malignant neoplasm of breast: Secondary | ICD-10-CM

## 2016-02-19 ENCOUNTER — Ambulatory Visit
Admission: RE | Admit: 2016-02-19 | Discharge: 2016-02-19 | Disposition: A | Discharge status: Home / Self Care | Payer: Managed Care, Other (non HMO) | Source: Ambulatory Visit | Attending: Family Medicine | Admitting: Family Medicine

## 2016-02-19 ENCOUNTER — Other Ambulatory Visit: Payer: Self-pay | Admitting: Family Medicine

## 2016-02-19 DIAGNOSIS — M545 Low back pain: Secondary | ICD-10-CM

## 2016-02-27 ENCOUNTER — Ambulatory Visit
Admission: RE | Admit: 2016-02-27 | Discharge: 2016-02-27 | Disposition: A | Discharge status: Home / Self Care | Payer: Managed Care, Other (non HMO) | Source: Ambulatory Visit

## 2016-02-27 DIAGNOSIS — Z1231 Encounter for screening mammogram for malignant neoplasm of breast: Secondary | ICD-10-CM

## 2016-06-06 ENCOUNTER — Other Ambulatory Visit: Payer: Self-pay

## 2016-06-06 DIAGNOSIS — Z1231 Encounter for screening mammogram for malignant neoplasm of breast: Secondary | ICD-10-CM

## 2017-01-15 ENCOUNTER — Other Ambulatory Visit: Payer: Self-pay | Admitting: Family Medicine

## 2017-01-15 DIAGNOSIS — Z1231 Encounter for screening mammogram for malignant neoplasm of breast: Secondary | ICD-10-CM

## 2017-03-02 ENCOUNTER — Ambulatory Visit
Admission: RE | Admit: 2017-03-02 | Discharge: 2017-03-02 | Disposition: A | Discharge status: Home / Self Care | Payer: Managed Care, Other (non HMO) | Source: Ambulatory Visit | Attending: Family Medicine | Admitting: Family Medicine

## 2017-03-02 DIAGNOSIS — Z1231 Encounter for screening mammogram for malignant neoplasm of breast: Secondary | ICD-10-CM

## 2018-01-22 ENCOUNTER — Other Ambulatory Visit: Payer: Self-pay | Admitting: Family Medicine

## 2018-01-22 DIAGNOSIS — Z1231 Encounter for screening mammogram for malignant neoplasm of breast: Secondary | ICD-10-CM

## 2018-02-14 ENCOUNTER — Encounter: Payer: Self-pay | Admitting: Gastroenterology

## 2018-02-18 ENCOUNTER — Encounter: Payer: Self-pay | Admitting: Gastroenterology

## 2018-03-22 ENCOUNTER — Ambulatory Visit
Admission: RE | Admit: 2018-03-22 | Discharge: 2018-03-22 | Disposition: A | Discharge status: Home / Self Care | Payer: Managed Care, Other (non HMO) | Source: Ambulatory Visit | Attending: Family Medicine | Admitting: Family Medicine

## 2018-03-22 DIAGNOSIS — Z1231 Encounter for screening mammogram for malignant neoplasm of breast: Secondary | ICD-10-CM

## 2018-05-03 ENCOUNTER — Encounter: Payer: Managed Care, Other (non HMO) | Admitting: Gastroenterology

## 2019-02-11 ENCOUNTER — Other Ambulatory Visit: Payer: Self-pay | Admitting: Family Medicine

## 2019-02-11 DIAGNOSIS — Z1231 Encounter for screening mammogram for malignant neoplasm of breast: Secondary | ICD-10-CM

## 2019-03-24 ENCOUNTER — Ambulatory Visit: Payer: Managed Care, Other (non HMO)

## 2019-04-15 DIAGNOSIS — R69 Illness, unspecified: Secondary | ICD-10-CM | POA: Diagnosis not present

## 2019-05-11 ENCOUNTER — Other Ambulatory Visit: Payer: Self-pay

## 2019-05-11 ENCOUNTER — Ambulatory Visit
Admission: RE | Admit: 2019-05-11 | Discharge: 2019-05-11 | Disposition: A | Discharge status: Home / Self Care | Payer: Medicare HMO | Source: Ambulatory Visit | Attending: Family Medicine | Admitting: Family Medicine

## 2019-05-11 DIAGNOSIS — Z1231 Encounter for screening mammogram for malignant neoplasm of breast: Secondary | ICD-10-CM | POA: Diagnosis not present

## 2019-05-13 DIAGNOSIS — Z008 Encounter for other general examination: Secondary | ICD-10-CM | POA: Diagnosis not present

## 2019-07-05 DIAGNOSIS — L821 Other seborrheic keratosis: Secondary | ICD-10-CM | POA: Diagnosis not present

## 2019-08-17 DIAGNOSIS — J3089 Other allergic rhinitis: Secondary | ICD-10-CM | POA: Diagnosis not present

## 2019-09-05 DIAGNOSIS — H47011 Ischemic optic neuropathy, right eye: Secondary | ICD-10-CM | POA: Diagnosis not present

## 2019-09-08 DIAGNOSIS — R69 Illness, unspecified: Secondary | ICD-10-CM | POA: Diagnosis not present

## 2019-09-24 ENCOUNTER — Other Ambulatory Visit: Payer: Self-pay

## 2019-09-24 DIAGNOSIS — Z20822 Contact with and (suspected) exposure to covid-19: Secondary | ICD-10-CM

## 2019-09-25 LAB — NOVEL CORONAVIRUS, NAA: SARS-CoV-2, NAA: NOT DETECTED

## 2019-09-26 DIAGNOSIS — Z0100 Encounter for examination of eyes and vision without abnormal findings: Secondary | ICD-10-CM | POA: Diagnosis not present

## 2019-10-12 DIAGNOSIS — L603 Nail dystrophy: Secondary | ICD-10-CM | POA: Diagnosis not present

## 2019-10-12 DIAGNOSIS — L821 Other seborrheic keratosis: Secondary | ICD-10-CM | POA: Diagnosis not present

## 2019-10-12 DIAGNOSIS — L258 Unspecified contact dermatitis due to other agents: Secondary | ICD-10-CM | POA: Diagnosis not present

## 2019-10-14 DIAGNOSIS — Z20828 Contact with and (suspected) exposure to other viral communicable diseases: Secondary | ICD-10-CM | POA: Diagnosis not present

## 2019-10-31 DIAGNOSIS — R69 Illness, unspecified: Secondary | ICD-10-CM | POA: Diagnosis not present

## 2019-11-02 DIAGNOSIS — N390 Urinary tract infection, site not specified: Secondary | ICD-10-CM | POA: Diagnosis not present

## 2019-11-14 DIAGNOSIS — Z23 Encounter for immunization: Secondary | ICD-10-CM | POA: Diagnosis not present

## 2019-11-14 DIAGNOSIS — E785 Hyperlipidemia, unspecified: Secondary | ICD-10-CM | POA: Diagnosis not present

## 2019-11-14 DIAGNOSIS — Z0000 Encounter for general adult medical examination without abnormal findings: Secondary | ICD-10-CM | POA: Diagnosis not present

## 2019-11-14 DIAGNOSIS — Z1389 Encounter for screening for other disorder: Secondary | ICD-10-CM | POA: Diagnosis not present

## 2019-12-29 DIAGNOSIS — Z20822 Contact with and (suspected) exposure to covid-19: Secondary | ICD-10-CM | POA: Diagnosis not present

## 2019-12-29 DIAGNOSIS — R030 Elevated blood-pressure reading, without diagnosis of hypertension: Secondary | ICD-10-CM | POA: Diagnosis not present

## 2019-12-29 DIAGNOSIS — B349 Viral infection, unspecified: Secondary | ICD-10-CM | POA: Diagnosis not present

## 2020-01-27 DIAGNOSIS — Z8542 Personal history of malignant neoplasm of other parts of uterus: Secondary | ICD-10-CM | POA: Diagnosis not present

## 2020-01-27 DIAGNOSIS — Z8249 Family history of ischemic heart disease and other diseases of the circulatory system: Secondary | ICD-10-CM | POA: Diagnosis not present

## 2020-01-27 DIAGNOSIS — E785 Hyperlipidemia, unspecified: Secondary | ICD-10-CM | POA: Diagnosis not present

## 2020-01-27 DIAGNOSIS — R69 Illness, unspecified: Secondary | ICD-10-CM | POA: Diagnosis not present

## 2020-01-27 DIAGNOSIS — R030 Elevated blood-pressure reading, without diagnosis of hypertension: Secondary | ICD-10-CM | POA: Diagnosis not present

## 2020-01-27 DIAGNOSIS — Z87440 Personal history of urinary (tract) infections: Secondary | ICD-10-CM | POA: Diagnosis not present

## 2020-01-27 DIAGNOSIS — Z7982 Long term (current) use of aspirin: Secondary | ICD-10-CM | POA: Diagnosis not present

## 2020-02-14 DIAGNOSIS — M542 Cervicalgia: Secondary | ICD-10-CM | POA: Diagnosis not present

## 2020-02-14 DIAGNOSIS — M62838 Other muscle spasm: Secondary | ICD-10-CM | POA: Diagnosis not present

## 2020-02-23 ENCOUNTER — Other Ambulatory Visit: Payer: Self-pay | Admitting: Family Medicine

## 2020-02-23 ENCOUNTER — Other Ambulatory Visit: Payer: Self-pay

## 2020-02-23 ENCOUNTER — Ambulatory Visit
Admission: RE | Admit: 2020-02-23 | Discharge: 2020-02-23 | Disposition: A | Discharge status: Home / Self Care | Payer: Medicare HMO | Source: Ambulatory Visit | Attending: Family Medicine | Admitting: Family Medicine

## 2020-02-23 DIAGNOSIS — M542 Cervicalgia: Secondary | ICD-10-CM

## 2020-03-05 DIAGNOSIS — M545 Low back pain: Secondary | ICD-10-CM | POA: Diagnosis not present

## 2020-03-05 DIAGNOSIS — M25561 Pain in right knee: Secondary | ICD-10-CM | POA: Diagnosis not present

## 2020-03-05 DIAGNOSIS — M25552 Pain in left hip: Secondary | ICD-10-CM | POA: Diagnosis not present

## 2020-05-22 ENCOUNTER — Other Ambulatory Visit: Payer: Self-pay | Admitting: Family Medicine

## 2020-05-22 DIAGNOSIS — Z1231 Encounter for screening mammogram for malignant neoplasm of breast: Secondary | ICD-10-CM

## 2020-05-30 ENCOUNTER — Ambulatory Visit
Admission: RE | Admit: 2020-05-30 | Discharge: 2020-05-30 | Disposition: A | Discharge status: Home / Self Care | Payer: Medicare HMO | Source: Ambulatory Visit | Attending: Family Medicine | Admitting: Family Medicine

## 2020-05-30 ENCOUNTER — Other Ambulatory Visit: Payer: Self-pay

## 2020-05-30 DIAGNOSIS — Z1231 Encounter for screening mammogram for malignant neoplasm of breast: Secondary | ICD-10-CM

## 2020-06-05 DIAGNOSIS — R69 Illness, unspecified: Secondary | ICD-10-CM | POA: Diagnosis not present

## 2020-06-09 DIAGNOSIS — Z0100 Encounter for examination of eyes and vision without abnormal findings: Secondary | ICD-10-CM | POA: Diagnosis not present

## 2020-07-10 DIAGNOSIS — R070 Pain in throat: Secondary | ICD-10-CM | POA: Diagnosis not present

## 2020-10-31 DIAGNOSIS — H6982 Other specified disorders of Eustachian tube, left ear: Secondary | ICD-10-CM | POA: Diagnosis not present

## 2020-10-31 DIAGNOSIS — H9202 Otalgia, left ear: Secondary | ICD-10-CM | POA: Diagnosis not present

## 2020-11-02 DIAGNOSIS — Z20822 Contact with and (suspected) exposure to covid-19: Secondary | ICD-10-CM | POA: Diagnosis not present

## 2020-11-08 DIAGNOSIS — C541 Malignant neoplasm of endometrium: Secondary | ICD-10-CM | POA: Diagnosis not present

## 2020-11-08 DIAGNOSIS — Z87891 Personal history of nicotine dependence: Secondary | ICD-10-CM | POA: Diagnosis not present

## 2020-11-08 DIAGNOSIS — Z90710 Acquired absence of both cervix and uterus: Secondary | ICD-10-CM | POA: Diagnosis not present

## 2020-11-08 DIAGNOSIS — Z8542 Personal history of malignant neoplasm of other parts of uterus: Secondary | ICD-10-CM | POA: Diagnosis not present

## 2020-11-08 DIAGNOSIS — R69 Illness, unspecified: Secondary | ICD-10-CM | POA: Diagnosis not present

## 2020-11-08 DIAGNOSIS — Z9079 Acquired absence of other genital organ(s): Secondary | ICD-10-CM | POA: Diagnosis not present

## 2020-11-08 DIAGNOSIS — Z01419 Encounter for gynecological examination (general) (routine) without abnormal findings: Secondary | ICD-10-CM | POA: Diagnosis not present

## 2020-11-08 DIAGNOSIS — Z90722 Acquired absence of ovaries, bilateral: Secondary | ICD-10-CM | POA: Diagnosis not present

## 2020-12-03 DIAGNOSIS — M85832 Other specified disorders of bone density and structure, left forearm: Secondary | ICD-10-CM | POA: Diagnosis not present

## 2020-12-03 DIAGNOSIS — M85852 Other specified disorders of bone density and structure, left thigh: Secondary | ICD-10-CM | POA: Diagnosis not present

## 2020-12-03 DIAGNOSIS — Z780 Asymptomatic menopausal state: Secondary | ICD-10-CM | POA: Diagnosis not present

## 2020-12-03 DIAGNOSIS — M8580 Other specified disorders of bone density and structure, unspecified site: Secondary | ICD-10-CM | POA: Diagnosis not present

## 2020-12-10 DIAGNOSIS — E785 Hyperlipidemia, unspecified: Secondary | ICD-10-CM | POA: Diagnosis not present

## 2020-12-10 DIAGNOSIS — M545 Low back pain, unspecified: Secondary | ICD-10-CM | POA: Diagnosis not present

## 2020-12-10 DIAGNOSIS — Z1389 Encounter for screening for other disorder: Secondary | ICD-10-CM | POA: Diagnosis not present

## 2020-12-10 DIAGNOSIS — Z0000 Encounter for general adult medical examination without abnormal findings: Secondary | ICD-10-CM | POA: Diagnosis not present

## 2020-12-10 DIAGNOSIS — Z8542 Personal history of malignant neoplasm of other parts of uterus: Secondary | ICD-10-CM | POA: Diagnosis not present

## 2020-12-10 DIAGNOSIS — Z23 Encounter for immunization: Secondary | ICD-10-CM | POA: Diagnosis not present

## 2021-02-13 DIAGNOSIS — Z1159 Encounter for screening for other viral diseases: Secondary | ICD-10-CM | POA: Diagnosis not present

## 2021-04-30 ENCOUNTER — Other Ambulatory Visit: Payer: Self-pay | Admitting: Family Medicine

## 2021-04-30 DIAGNOSIS — Z1231 Encounter for screening mammogram for malignant neoplasm of breast: Secondary | ICD-10-CM

## 2021-05-06 DIAGNOSIS — M5432 Sciatica, left side: Secondary | ICD-10-CM | POA: Diagnosis not present

## 2021-05-06 DIAGNOSIS — R52 Pain, unspecified: Secondary | ICD-10-CM | POA: Diagnosis not present

## 2021-05-06 DIAGNOSIS — R6889 Other general symptoms and signs: Secondary | ICD-10-CM | POA: Diagnosis not present

## 2021-05-21 ENCOUNTER — Ambulatory Visit: Payer: Medicare HMO | Attending: Family Medicine | Admitting: Physical Therapy

## 2021-05-21 ENCOUNTER — Encounter: Payer: Self-pay | Admitting: Physical Therapy

## 2021-05-21 ENCOUNTER — Other Ambulatory Visit: Payer: Self-pay

## 2021-05-21 DIAGNOSIS — M25552 Pain in left hip: Secondary | ICD-10-CM | POA: Insufficient documentation

## 2021-05-21 DIAGNOSIS — M6281 Muscle weakness (generalized): Secondary | ICD-10-CM | POA: Diagnosis not present

## 2021-05-21 NOTE — Patient Instructions (Signed)
Access Code: [facility] URL: [email or web address] Date: 05/21/2021 Prepared by: Ruben Im  Exercises Clamshell - 1 x daily - 7 x weekly - 1 sets - 10 reps Sidestepping in Squat with Resistance and Arms Forward - 1 x daily - 7 x weekly - 1 sets - 10 reps Half Kneeling Hip Flexor Stretch - 1 x daily - 7 x weekly - 1 sets - 5 reps

## 2021-05-21 NOTE — Therapy (Signed)
[facility] [address] [place], [place], [address] Phone: [phone]   Fax:  [phone]  Physical Therapy Evaluation  Patient Details  Name: Erika Jones MRN: [id number] Date of Birth: [birth year] Referring Provider (PT): Dr. Carol Ada   Encounter Date: 05/21/2021   PT End of Session - 05/21/21 1700     Visit Number 1    Date for PT Re-Evaluation 08/13/21    Authorization Type [employer] Medicare    PT Start Time 0800    PT Stop Time 0845    PT Time Calculation (min) 45 min    Activity Tolerance Patient tolerated treatment well             Past Medical History:  Diagnosis Date   Chronic headaches    HLD (hyperlipidemia)    Sleep apnea    Uterine cancer ([place]) 2010    Past Surgical History:  Procedure Laterality Date   bladder tact with sling     COLONOSCOPY     DILATION AND CURETTAGE OF UTERUS     EYE SURGERY     crossed eyes   INCONTINENCE SURGERY     LAPAROSCOPIC HYSTERECTOMY     MENISCUS REPAIR Right     There were no vitals filed for this visit.    Subjective Assessment - 05/21/21 0853     Subjective Symptoms present 1 year.  Used to have numbness/tingling left LE but that's better now.  Started hurting left hip. Saw Dr. Aurea Graff PA.  x-rays didn't show anything.  2 months ago walked  4 miles and that worsened.  Pain with washing dishes or carrying things will worsen.  Pain along both lateral hips.  I do a lot of stretching with bands.  I've worked with Leeroy Cha.  I love her class.  "Today is a better day than usual."    Pertinent History new diagnosis of osteopenia; hx of LBP and neck pain with acupuncture in the past    Limitations Standing    How long can you sit comfortably? I can sit OK but when rise a lot of pain (1 hour)    How long can you stand comfortably? Few minutes    How long can you walk comfortably? Try to do 2 miles routinely    Patient Stated Goals I don't want  any pain so I don't have to take Ibuprofen to go to bed    Currently in Pain? Yes    Pain Score 5     Pain Location Hip    Pain Orientation Right;Left;Lateral    Pain Type Chronic pain    Pain Radiating Towards have had some LBP    Pain Onset More than a month ago    Pain Frequency Intermittent    Aggravating Factors  standing to wash dishes; carrying things; after sitting; pain upon rising; static positions    Pain Relieving Factors ibuprofen to sleep                [facility] PT Assessment - 05/21/21 0001       Assessment   Medical Diagnosis sciatica left side    Referring Provider (PT) Dr. Carol Ada    Onset Date/Surgical Date --   several months   Next MD Visit as needed    Prior Therapy for LBP      Precautions   Precautions None      Restrictions   Weight Bearing Restrictions No  Balance Screen   Has the patient fallen in the past 6 months Yes    How many times? stood on a stool and fell backward, hurt shoulder    Has the patient had a decrease in activity level because of a fear of falling?  No    Is the patient reluctant to leave their home because of a fear of falling?  No      Home Ecologist residence    Living Arrangements Spouse/significant other    Type of [place]      Prior Function   Level of [place] camping, involved in church      Observation/Other Assessments   Focus on Therapeutic Outcomes (FOTO)  61%      AROM   Overall AROM Comments no directional preference with repeated movements in lying    Lumbar Flexion WFLs    Lumbar Extension WFLs    Lumbar - Right Side Bend 20    Lumbar - Left Side Bend 15   painful     PROM   Overall PROM Comments symmetrical hip internal and external rotation WFLs      Strength   Overall Strength Comments Able to rise from a standard chair without UE use with ease; decreased stance time and stability on left with single leg    Right Hip  Extension 5/5    Right Hip ABduction 4+/5    Left Hip Extension 5/5    Left Hip ABduction 4/5    Lumbar Flexion 4/5    Lumbar Extension 4/5      Flexibility   Soft Tissue Assessment /Muscle Length yes    Hamstrings 90 degrees bil    Quadriceps dec right hip flexor length      Palpation   Palpation comment no tender points today in lumbar, hip or thigh musculature      Slump test   Findings Negative      Prone Knee Bend Test   Findings Negative      Straight Leg Raise   Findings Negative      Hip Scouring   Findings Negative                        Objective measurements completed on examination: See above findings.               PT Education - 05/21/21 1659     Education Details clams sidelying; side step with band (already doing but recommended performing in crouched position to dec activation of ITB); kneeling hip flexor stretch with focus on posterior pelvic tilt    Person(s) Educated Patient    Methods Explanation;Demonstration;Handout    Comprehension Returned demonstration;Verbalized understanding              PT Short Term Goals - 05/21/21 1716       PT SHORT TERM GOAL #1   Title The patient will demonstrate good technique with initial HEP    Time 6    Period Weeks    Status New    Target Date 07/02/21      PT SHORT TERM GOAL #2   Title The patient will report 30% improvement in left hip pain with rising following sitting, standing to do dishes    Time 6    Period Weeks    Status New      PT SHORT TERM GOAL #3   Title The patient  will report decreased use of ibuprofen needed for bed    Time 6    Period Weeks    Status New               PT Long Term Goals - 05/21/21 1719       PT LONG TERM GOAL #1   Title The patient will be independent in safe self progression of HEP    Time 12    Period Weeks    Status New    Target Date 08/13/21      PT LONG TERM GOAL #2   Title The patient will be able to stand to  do dishes and rise after sitting with 60% improvement    Time 12    Period Weeks    Status New      PT LONG TERM GOAL #3   Title Lumbo pelvic and hip strength grossly 4+/5 needed for standing/walking longer periods of time and carrying medium objects    Time 12    Period Weeks    Status New      PT LONG TERM GOAL #4   Title FOTO score improved from 61% to 71% indicating improved function with less pain    Time 12    Period Weeks    Status New                    Plan - 05/21/21 1319     Clinical Impression Statement The patient reports 1 year ago she had the onset of left LE pain/numbness/tingling which has since improved. Some LBP.   A few months ago, she began having left > right lateral hip and thigh pain possibly triggered from a longer than usual 4 mile walk with a friend.  She reports a worsening of pain with particularly static positions like standing to do dishes, after sitting a while but also with carrying things.  She takes ibuprofen to help her sleep.  Her lumbar ROM is WFLs except some limitation and pain with sidebending.  No directional preference identified with flexion and extension in lying.  Decreased single leg stance time and stability on left.  Decreased right hip flexor length.  Weakness in left > right glute medius.  Negative neural signs.  No pain with hip rotation or scour.  No muscular tender points although the pt states today is a "good day."  She continues to walk 2 miles routinely and performs stretches.  Recommend PT for symptom alleviation and exercise technique focus to address above deficits.    Personal Factors and Comorbidities Comorbidity 1;Comorbidity 2;Time since onset of injury/illness/exacerbation    Comorbidities new diagnosis of osteopenia, LBP    Examination-Activity Limitations Sit;Carry;Stand;Sleep;Locomotion Level    Examination-Participation Restrictions Meal Prep;Cleaning;Other    Stability/Clinical Decision Making  Stable/Uncomplicated    Clinical Decision Making Low    Rehab Potential Good    PT Frequency 1x / week    PT Duration 12 weeks    PT Treatment/Interventions ADLs/Self Care Home Management;Aquatic Therapy;Cryotherapy;Electrical Stimulation;Moist Heat;Ultrasound;Traction;Neuromuscular re-education;Therapeutic exercise;Therapeutic activities;Patient/family education;Manual techniques;Dry needling;Taping    PT Next Visit Plan review glute med ex's from HEP and 1/2 knee hip flexor stretch (with emphasis on post pelvic tilt);  progress lumbo/pelvic/hip core strengthening and mobility ex's;  discuss ex implications for new diagnosis of osteopenia    PT Home Exercise Plan [place] and Agree with Plan of Care Patient  Patient will benefit from skilled therapeutic intervention in order to improve the following deficits and impairments:  Decreased range of motion, Pain, Impaired flexibility, Decreased strength  Visit Diagnosis: Pain in left hip - Plan: PT plan of care cert/re-cert  Muscle weakness (generalized) - Plan: PT plan of care cert/re-cert     Problem List Patient Active Problem List   Diagnosis Date Noted   Unspecified constipation 01/24/2013   Bowel incontinence 01/24/2013   GROSS HEMATURIA 03/15/2009   PAIN IN JOINT, MULTIPLE SITES 03/15/2009   FATIGUE 03/15/2009   HEMATURIA, HX OF 03/15/2009   HYPERTENSION 10/13/2008   STRABISMUS 11/04/2007   CHEST PAIN, ATYPICAL 11/04/2007   IRRITABLE BOWEL SYNDROME, HX OF 11/04/2007   Other and unspecified hyperlipidemia 05/21/2007   TELOGEN EFFLUVIUM 05/21/2007   Ruben Im, PT 05/21/21 5:24 PM Phone: [phone] Fax: [phone]  Alvera Singh 05/21/2021, 5:23 PM  [facility] [address] [place], [place], [address] Phone: [phone]   Fax:  [phone]  Name: Penda Venturi Doring MRN: [id number] Date of Birth: [birth year]

## 2021-05-29 ENCOUNTER — Ambulatory Visit: Payer: Medicare HMO | Admitting: Physical Therapy

## 2021-06-07 ENCOUNTER — Ambulatory Visit: Payer: Medicare HMO | Attending: Family Medicine | Admitting: Physical Therapy

## 2021-06-07 ENCOUNTER — Other Ambulatory Visit: Payer: Self-pay

## 2021-06-07 DIAGNOSIS — M25552 Pain in left hip: Secondary | ICD-10-CM | POA: Insufficient documentation

## 2021-06-07 DIAGNOSIS — M6281 Muscle weakness (generalized): Secondary | ICD-10-CM | POA: Diagnosis not present

## 2021-06-07 NOTE — Therapy (Signed)
[facility] [address], [place], [place], [address] Phone: [phone]   Fax:  [phone]  Physical Therapy Treatment  Patient Details  Name: Erika Jones MRN: [id number] Date of Birth: [birth year] Referring Provider (PT): Dr. Carol Ada   Encounter Date: 06/07/2021   PT End of Session - 06/07/21 0953     Visit Number 2    Date for PT Re-Evaluation 08/13/21    Authorization Type [employer] Medicare    PT Start Time 0845    PT Stop Time 0935    PT Time Calculation (min) 50 min    Activity Tolerance Patient tolerated treatment well             Past Medical History:  Diagnosis Date   Chronic headaches    HLD (hyperlipidemia)    Sleep apnea    Uterine cancer ([place]) 2010    Past Surgical History:  Procedure Laterality Date   bladder tact with sling     COLONOSCOPY     DILATION AND CURETTAGE OF UTERUS     EYE SURGERY     crossed eyes   INCONTINENCE SURGERY     LAPAROSCOPIC HYSTERECTOMY     MENISCUS REPAIR Right     There were no vitals filed for this visit.   Subjective Assessment - 06/07/21 0850     Subjective Still having pain in the mornings, after sitting I get really stiff.  I think it's inflammation.  Yesterday was a bad day with sciatica to my foot.    Pertinent History new diagnosis of osteopenia; hx of LBP and neck pain with acupuncture in the past    How long can you sit comfortably? I can sit OK but when rise a lot of pain (1 hour)    Currently in Pain? Yes    Pain Score 4     Pain Location Hip    Pain Orientation Left;Right    Pain Type Chronic pain                               [facility] Adult PT Treatment/Exercise - 06/07/21 0001       Self-Care   Self-Care Other Self-Care Comments    Other Self-Care Comments  discussion on inflammation and nutrition: recommend cut out sugar, processed foods and increase plant based food for 2 weeks      Knee/Hip  Exercises: Seated   Other Seated Knee/Hip Exercises discussed her current HEP including 2 ex's from initial visit to discuss effect on symptoms      Moist Heat Therapy   Number Minutes Moist Heat 5 Minutes    Moist Heat Location Hip   left     Manual Therapy   Manual therapy comments piriformis contract relax 3x 5 sec    Joint Mobilization sidelying neutral gapping, pelvic distraction; long axis hip pull, inferior mob, AP in internal rotation grade 3 3x30 sec each    Soft tissue mobilization left gluteals and piriformis              Trigger Point Dry Needling - 06/07/21 0001     Consent Given? Yes    Education Handout Provided Yes    Muscles Treated Back/Hip Gluteus minimus;Gluteus medius;Gluteus maximus;Piriformis    Dry Needling Comments left only; positioned in right sidelying    Gluteus Minimus Response Palpable increased muscle length    Gluteus Medius Response Palpable increased muscle length  Gluteus Maximus Response Palpable increased muscle length    Piriformis Response Palpable increased muscle length                  PT Education - 06/07/21 0949     Education Details DN info and after care    Person(s) Educated Patient    Methods Explanation;Demonstration;Handout    Comprehension Returned demonstration;Verbalized understanding              PT Short Term Goals - 05/21/21 1716       PT SHORT TERM GOAL #1   Title The patient will demonstrate good technique with initial HEP    Time 6    Period Weeks    Status New    Target Date 07/02/21      PT SHORT TERM GOAL #2   Title The patient will report 30% improvement in left hip pain with rising following sitting, standing to do dishes    Time 6    Period Weeks    Status New      PT SHORT TERM GOAL #3   Title The patient will report decreased use of ibuprofen needed for bed    Time 6    Period Weeks    Status New               PT Long Term Goals - 05/21/21 1719       PT LONG TERM  GOAL #1   Title The patient will be independent in safe self progression of HEP    Time 12    Period Weeks    Status New    Target Date 08/13/21      PT LONG TERM GOAL #2   Title The patient will be able to stand to do dishes and rise after sitting with 60% improvement    Time 12    Period Weeks    Status New      PT LONG TERM GOAL #3   Title Lumbo pelvic and hip strength grossly 4+/5 needed for standing/walking longer periods of time and carrying medium objects    Time 12    Period Weeks    Status New      PT LONG TERM GOAL #4   Title FOTO score improved from 61% to 71% indicating improved function with less pain    Time 12    Period Weeks    Status New                   Plan - 06/07/21 5400     Clinical Impression Statement No change since initial evaluation despite regular performance of ex's. Discussed self care strategies for pain relief and discussed benefit of dry needling and manual therapy to increase effectiveness of the exercises.  DN performed on left gluteals and piriformis muscles in addition to hip joint mobilization.  Improved soft tissue and joint mobility following treatment session.  Therapist monitoring response to all interventions.    Personal Factors and Comorbidities Comorbidity 1;Comorbidity 2;Time since onset of injury/illness/exacerbation    Comorbidities new diagnosis of osteopenia, LBP    Examination-Activity Limitations Sit;Carry;Stand;Sleep;Locomotion Level    Stability/Clinical Decision Making Stable/Uncomplicated    PT Frequency 1x / week    PT Duration 12 weeks    PT Treatment/Interventions ADLs/Self Care Home Management;Aquatic Therapy;Cryotherapy;Electrical Stimulation;Moist Heat;Ultrasound;Traction;Neuromuscular re-education;Therapeutic exercise;Therapeutic activities;Patient/family education;Manual techniques;Dry needling;Taping    PT Next Visit Plan assess response to DN#1; left hip joint mobs;  pain relief prior to upcoming  camping  trip to [place];    progress lumbo/pelvic/hip core strengthening especially glute medius;  discuss ex implications for new diagnosis of osteopenia    PT [place] and Agree with Plan of Care Patient             Patient will benefit from skilled therapeutic intervention in order to improve the following deficits and impairments:  Decreased range of motion, Pain, Impaired flexibility, Decreased strength  Visit Diagnosis: Pain in left hip  Muscle weakness (generalized)     Problem List Patient Active Problem List   Diagnosis Date Noted   Unspecified constipation 01/24/2013   Bowel incontinence 01/24/2013   GROSS HEMATURIA 03/15/2009   PAIN IN JOINT, MULTIPLE SITES 03/15/2009   FATIGUE 03/15/2009   HEMATURIA, HX OF 03/15/2009   HYPERTENSION 10/13/2008   STRABISMUS 11/04/2007   CHEST PAIN, ATYPICAL 11/04/2007   IRRITABLE BOWEL SYNDROME, HX OF 11/04/2007   Other and unspecified hyperlipidemia 05/21/2007   TELOGEN EFFLUVIUM 05/21/2007   Ruben Im, PT 06/07/21 10:03 AM Phone: [phone] Fax: [phone]   Alvera Singh 06/07/2021, 10:02 AM  [facility] [address] [place], [place], [address] Phone: [phone]   Fax:  [phone]  Name: Jahnavi Muratore Seibold MRN: [id number] Date of Birth: Dec 30, 1955

## 2021-06-07 NOTE — Patient Instructions (Signed)

## 2021-06-14 ENCOUNTER — Other Ambulatory Visit: Payer: Self-pay

## 2021-06-14 ENCOUNTER — Ambulatory Visit: Payer: Medicare HMO | Admitting: Physical Therapy

## 2021-06-14 DIAGNOSIS — M25552 Pain in left hip: Secondary | ICD-10-CM

## 2021-06-14 DIAGNOSIS — M6281 Muscle weakness (generalized): Secondary | ICD-10-CM | POA: Diagnosis not present

## 2021-06-14 NOTE — Therapy (Addendum)
[facility] [address], [place], [place], [address] Phone: [phone]   Fax:  [phone]  Physical Therapy Treatment/Discharge Summary   Patient Details  Name: Erika Jones Crace MRN: [id number] Date of Birth: 10-Feb-1956 Referring Provider (PT): Dr. Carol Ada   Encounter Date: 06/14/2021   PT End of Session - 06/14/21 0842     Visit Number 3    Date for PT Re-Evaluation 08/13/21    Authorization Type [employer] Medicare    PT Start Time [phone]    PT Stop Time 0927    PT Time Calculation (min) 45 min    Activity Tolerance Patient tolerated treatment well    Behavior During Therapy [facility] for tasks assessed/performed             Past Medical History:  Diagnosis Date   Chronic headaches    HLD (hyperlipidemia)    Sleep apnea    Uterine cancer ([place]) 2010    Past Surgical History:  Procedure Laterality Date   bladder tact with sling     COLONOSCOPY     DILATION AND CURETTAGE OF UTERUS     EYE SURGERY     crossed eyes   INCONTINENCE SURGERY     LAPAROSCOPIC HYSTERECTOMY     MENISCUS REPAIR Right     There were no vitals filed for this visit.   Subjective Assessment - 06/14/21 0847     Subjective Woke up with some sciatic pain (3/10), once I got moving it improved.    Pertinent History new diagnosis of osteopenia; hx of LBP and neck pain with acupuncture in the past    Currently in Pain? Yes    Pain Score 4     Pain Location Hip    Pain Orientation Left    Pain Descriptors / Indicators Dull    Aggravating Factors  Waking in the morning    Pain Relieving Factors Stretching, exercises    Multiple Pain Sites No                               [facility] Adult PT Treatment/Exercise - 06/14/21 0001       Lumbar Exercises: Stretches   Hip Flexor Stretch Limitations Single leg, tall kneeling Bil 2x20 sec: VC for positioning      Lumbar Exercises: Aerobic   Nustep L3 x 6 min with  PTA present to discuss status      Knee/Hip Exercises: Standing   Other Standing Knee Exercises Blue loop below knee side stepping carpet to carpet 3x 4 lengths   VC to slow speed     Knee/Hip Exercises: Prone   Hip Extension AROM;Strengthening;Left;3 sets;10 reps    Hip Extension Limitations pillow under pelvis    Other Prone Exercises heel presses 5 sec hold 5x                    PT Education - 06/14/21 0927     Education Details HEP    Person(s) Educated Patient    Methods Explanation;Demonstration;Tactile cues;Verbal cues;Handout    Comprehension Returned demonstration;Verbalized understanding              PT Short Term Goals - 05/21/21 1716       PT SHORT TERM GOAL #1   Title The patient will demonstrate good technique with initial HEP    Time 6    Period Weeks    Status New  Target Date 07/02/21      PT SHORT TERM GOAL #2   Title The patient will report 30% improvement in left hip pain with rising following sitting, standing to do dishes    Time 6    Period Weeks    Status New      PT SHORT TERM GOAL #3   Title The patient will report decreased use of ibuprofen needed for bed    Time 6    Period Weeks    Status New               PT Long Term Goals - 05/21/21 1719       PT LONG TERM GOAL #1   Title The patient will be independent in safe self progression of HEP    Time 12    Period Weeks    Status New    Target Date 08/13/21      PT LONG TERM GOAL #2   Title The patient will be able to stand to do dishes and rise after sitting with 60% improvement    Time 12    Period Weeks    Status New      PT LONG TERM GOAL #3   Title Lumbo pelvic and hip strength grossly 4+/5 needed for standing/walking longer periods of time and carrying medium objects    Time 12    Period Weeks    Status New      PT LONG TERM GOAL #4   Title FOTO score improved from 61% to 71% indicating improved function with less pain    Time 12    Period Weeks     Status New                   Plan - 06/14/21 4235     Clinical Impression Statement Pt arrives with a little Lt hip pain, but makes no further mention of this pain for the duration of the treatment. Pt is compliant with new HEP and was instructed to move her tband more distally to increase her challenge with side stepping. PTA added prone hip extension and heel presses to HEP with pillow supporting pelvis. PTA suggested she hold her swimming exercise until she can demonstrate better core stabilization and hip strength in prone. Pt will be out of town x 1 month.    Personal Factors and Comorbidities Comorbidity 1;Comorbidity 2;Time since onset of injury/illness/exacerbation    Comorbidities new diagnosis of osteopenia, LBP    Examination-Activity Limitations Sit;Carry;Stand;Sleep;Locomotion Level    Examination-Participation Restrictions Meal Prep;Cleaning;Other    Stability/Clinical Decision Making Stable/Uncomplicated    Rehab Potential Good    PT Frequency 1x / week    PT Duration 12 weeks    PT Treatment/Interventions ADLs/Self Care Home Management;Aquatic Therapy;Cryotherapy;Electrical Stimulation;Moist Heat;Ultrasound;Traction;Neuromuscular re-education;Therapeutic exercise;Therapeutic activities;Patient/family education;Manual techniques;Dry needling;Taping    PT Next Visit Plan DN if seen by PT, otherwise continue to progress core and hip strength/stabilization    PT Home Exercise Plan [place] and Agree with Plan of Care Patient             Patient will benefit from skilled therapeutic intervention in order to improve the following deficits and impairments:  Decreased range of motion, Pain, Impaired flexibility, Decreased strength  Visit Diagnosis: Pain in left hip  Muscle weakness (generalized)  PHYSICAL THERAPY DISCHARGE SUMMARY  Visits from Start of Care: 3  Current functional level related to goals / functional outcomes: The patient had an  out of town trip for a month and has not called to resume services.  Will discharge from PT at this time.     Remaining deficits: As above   Education / Equipment: HEP   Patient agrees to discharge. Patient goals were partially met. Patient is being discharged due to not returning since the last visit.    Problem List Patient Active Problem List   Diagnosis Date Noted   Unspecified constipation 01/24/2013   Bowel incontinence 01/24/2013   GROSS HEMATURIA 03/15/2009   PAIN IN JOINT, MULTIPLE SITES 03/15/2009   FATIGUE 03/15/2009   HEMATURIA, HX OF 03/15/2009   HYPERTENSION 10/13/2008   STRABISMUS 11/04/2007   CHEST PAIN, ATYPICAL 11/04/2007   IRRITABLE BOWEL SYNDROME, HX OF 11/04/2007   Other and unspecified hyperlipidemia 05/21/2007   TELOGEN EFFLUVIUM 05/21/2007   Ruben Im, PT 10/15/21 9:24 AM Phone: [phone] Fax: [phone]  Amillion [place], PTA 06/14/2021, 9:32 AM  [facility] [address], [place], [place], [address] Phone: [phone]   Fax:  [phone]  Name: Yoshie Kosel Kirk MRN: [id number] Date of Birth: [birth year]

## 2021-07-17 ENCOUNTER — Ambulatory Visit
Admission: RE | Admit: 2021-07-17 | Discharge: 2021-07-17 | Disposition: A | Discharge status: Home / Self Care | Payer: Medicare HMO | Source: Ambulatory Visit | Attending: Family Medicine | Admitting: Family Medicine

## 2021-07-17 ENCOUNTER — Other Ambulatory Visit: Payer: Self-pay

## 2021-07-17 DIAGNOSIS — H5203 Hypermetropia, bilateral: Secondary | ICD-10-CM | POA: Diagnosis not present

## 2021-07-17 DIAGNOSIS — Z1231 Encounter for screening mammogram for malignant neoplasm of breast: Secondary | ICD-10-CM | POA: Diagnosis not present

## 2021-10-28 DIAGNOSIS — Z0100 Encounter for examination of eyes and vision without abnormal findings: Secondary | ICD-10-CM | POA: Diagnosis not present

## 2021-11-13 DIAGNOSIS — Z87891 Personal history of nicotine dependence: Secondary | ICD-10-CM | POA: Diagnosis not present

## 2021-11-13 DIAGNOSIS — Z8542 Personal history of malignant neoplasm of other parts of uterus: Secondary | ICD-10-CM | POA: Diagnosis not present

## 2021-11-13 DIAGNOSIS — Z90722 Acquired absence of ovaries, bilateral: Secondary | ICD-10-CM | POA: Diagnosis not present

## 2021-11-13 DIAGNOSIS — R69 Illness, unspecified: Secondary | ICD-10-CM | POA: Diagnosis not present

## 2021-11-13 DIAGNOSIS — Z90710 Acquired absence of both cervix and uterus: Secondary | ICD-10-CM | POA: Diagnosis not present

## 2021-11-13 DIAGNOSIS — Z01419 Encounter for gynecological examination (general) (routine) without abnormal findings: Secondary | ICD-10-CM | POA: Diagnosis not present

## 2021-11-13 DIAGNOSIS — Z9079 Acquired absence of other genital organ(s): Secondary | ICD-10-CM | POA: Diagnosis not present

## 2021-11-13 DIAGNOSIS — Z1272 Encounter for screening for malignant neoplasm of vagina: Secondary | ICD-10-CM | POA: Diagnosis not present

## 2021-11-25 DIAGNOSIS — Z0100 Encounter for examination of eyes and vision without abnormal findings: Secondary | ICD-10-CM | POA: Diagnosis not present

## 2021-11-28 DIAGNOSIS — R071 Chest pain on breathing: Secondary | ICD-10-CM | POA: Diagnosis not present

## 2021-12-09 ENCOUNTER — Ambulatory Visit
Admission: RE | Admit: 2021-12-09 | Discharge: 2021-12-09 | Disposition: A | Discharge status: Home / Self Care | Payer: Medicare HMO | Source: Ambulatory Visit | Attending: Family Medicine | Admitting: Family Medicine

## 2021-12-09 ENCOUNTER — Other Ambulatory Visit: Payer: Self-pay | Admitting: Family Medicine

## 2021-12-09 ENCOUNTER — Other Ambulatory Visit: Payer: Self-pay

## 2021-12-09 DIAGNOSIS — R059 Cough, unspecified: Secondary | ICD-10-CM | POA: Diagnosis not present

## 2021-12-09 DIAGNOSIS — R053 Chronic cough: Secondary | ICD-10-CM | POA: Diagnosis not present

## 2021-12-09 DIAGNOSIS — Z87440 Personal history of urinary (tract) infections: Secondary | ICD-10-CM | POA: Diagnosis not present

## 2021-12-20 DIAGNOSIS — R0781 Pleurodynia: Secondary | ICD-10-CM | POA: Diagnosis not present

## 2021-12-20 DIAGNOSIS — J40 Bronchitis, not specified as acute or chronic: Secondary | ICD-10-CM | POA: Diagnosis not present

## 2022-01-20 DIAGNOSIS — E785 Hyperlipidemia, unspecified: Secondary | ICD-10-CM | POA: Diagnosis not present

## 2022-01-20 DIAGNOSIS — G4733 Obstructive sleep apnea (adult) (pediatric): Secondary | ICD-10-CM | POA: Diagnosis not present

## 2022-01-20 DIAGNOSIS — Z1389 Encounter for screening for other disorder: Secondary | ICD-10-CM | POA: Diagnosis not present

## 2022-01-20 DIAGNOSIS — Z0000 Encounter for general adult medical examination without abnormal findings: Secondary | ICD-10-CM | POA: Diagnosis not present

## 2022-03-24 DIAGNOSIS — Z0100 Encounter for examination of eyes and vision without abnormal findings: Secondary | ICD-10-CM | POA: Diagnosis not present

## 2022-06-11 ENCOUNTER — Other Ambulatory Visit: Payer: Self-pay | Admitting: Family Medicine

## 2022-06-11 DIAGNOSIS — Z1231 Encounter for screening mammogram for malignant neoplasm of breast: Secondary | ICD-10-CM

## 2022-06-17 DIAGNOSIS — Z7184 Encounter for health counseling related to travel: Secondary | ICD-10-CM | POA: Diagnosis not present

## 2022-06-17 DIAGNOSIS — R0789 Other chest pain: Secondary | ICD-10-CM | POA: Diagnosis not present

## 2022-06-17 IMAGING — MG DIGITAL SCREENING BILAT W/ TOMO W/ CAD
6 of 12 series · 6 of 36 positions shown · non-contrast
Comparison: Previous exam(s).

CLINICAL DATA: Screening.

EXAM:
DIGITAL SCREENING BILATERAL MAMMOGRAM WITH TOMO AND CAD

[R CC synth-2D]
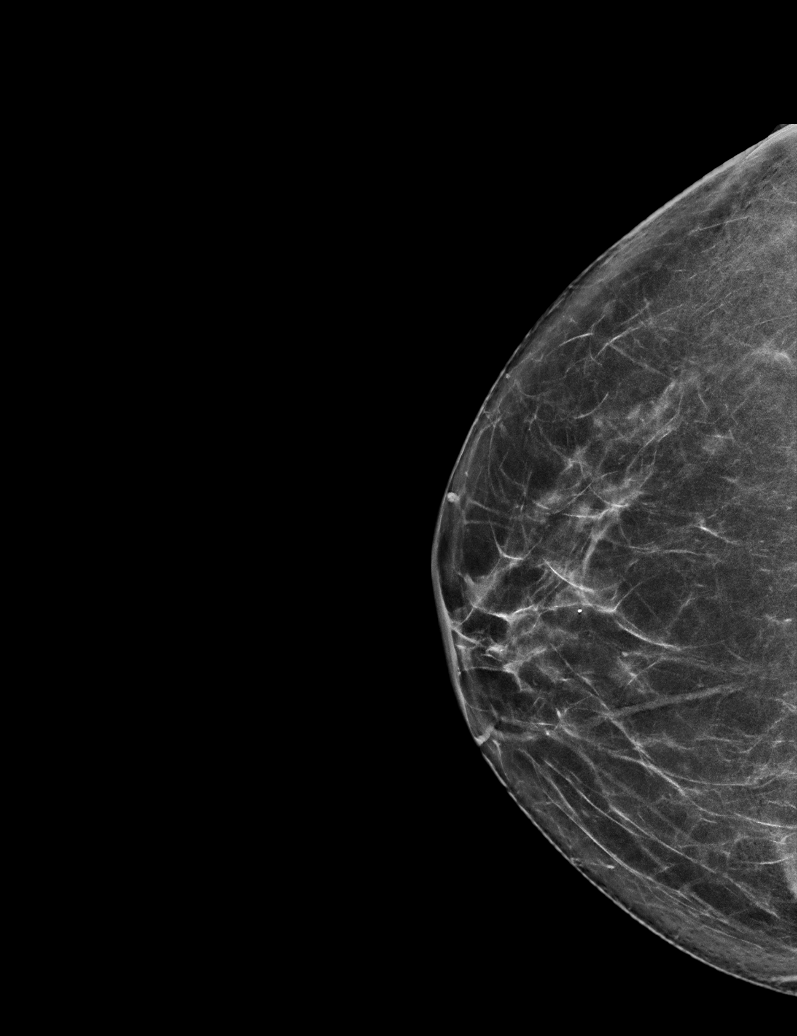

[L CC synth-2D (1 of 2)]
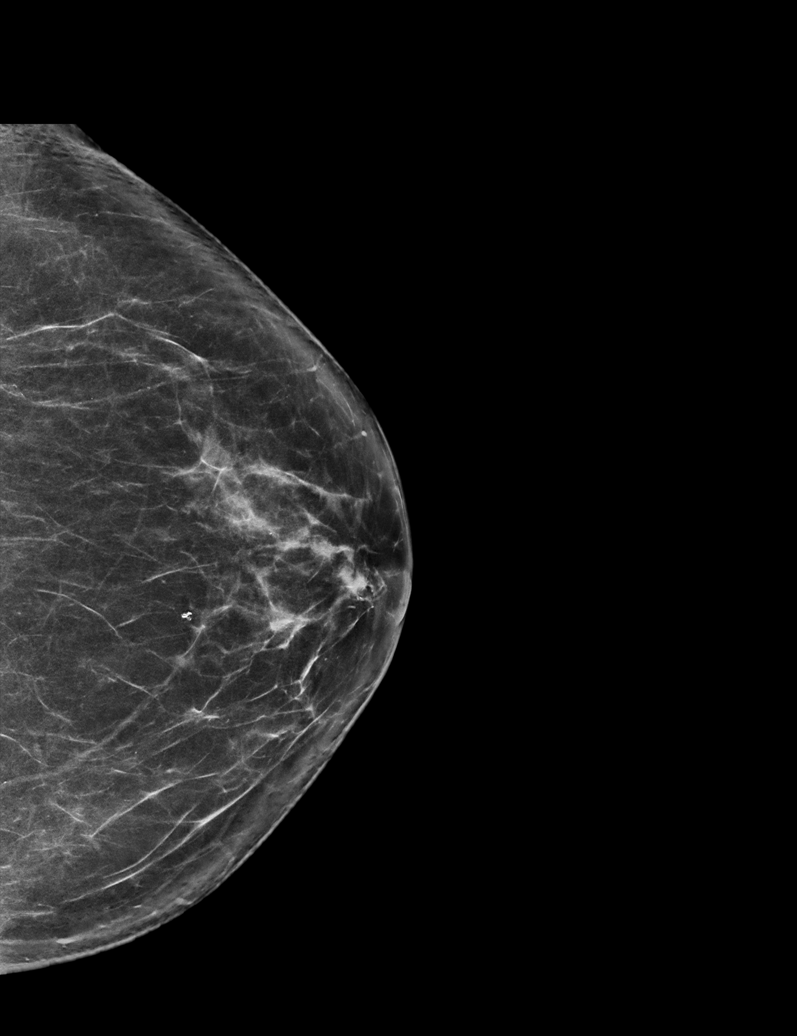

[L CC synth-2D (2 of 2)]
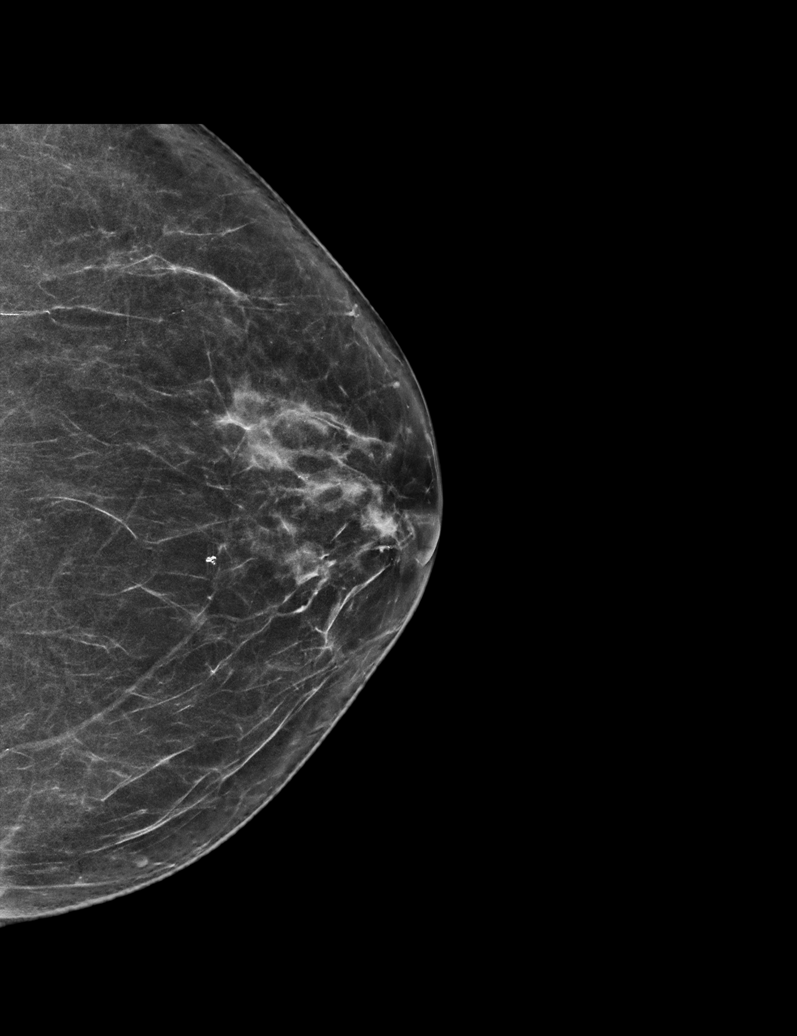

[R MLO synth-2D (1 of 2)]
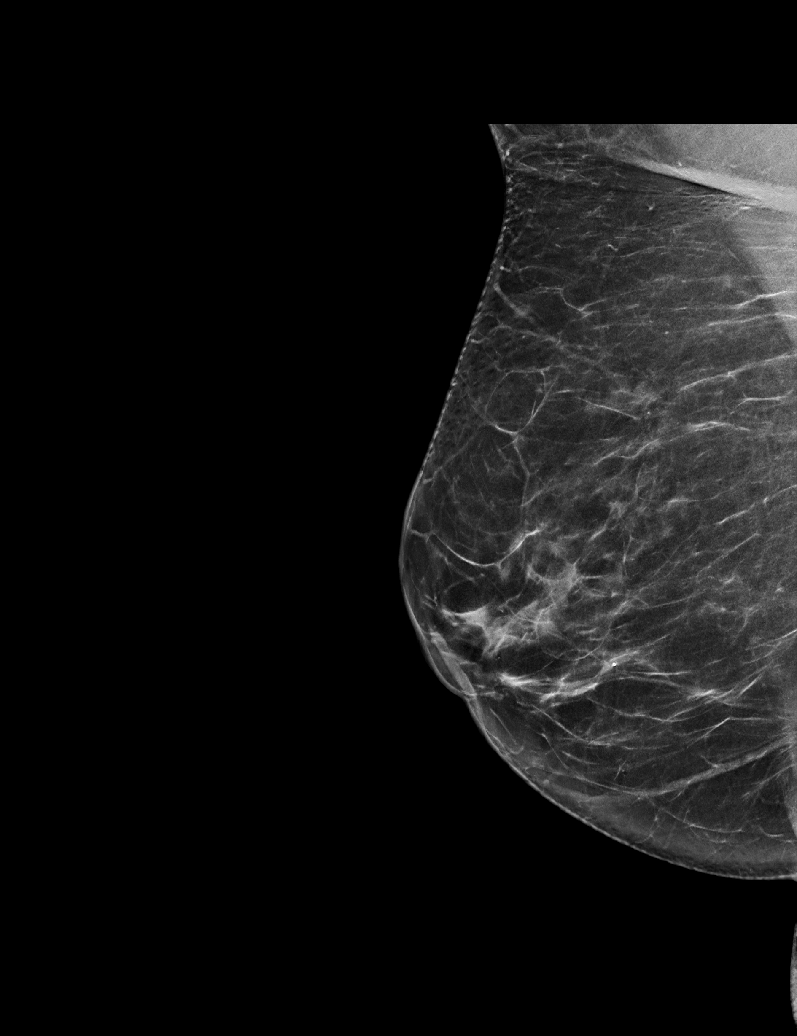

[R MLO synth-2D (2 of 2)]
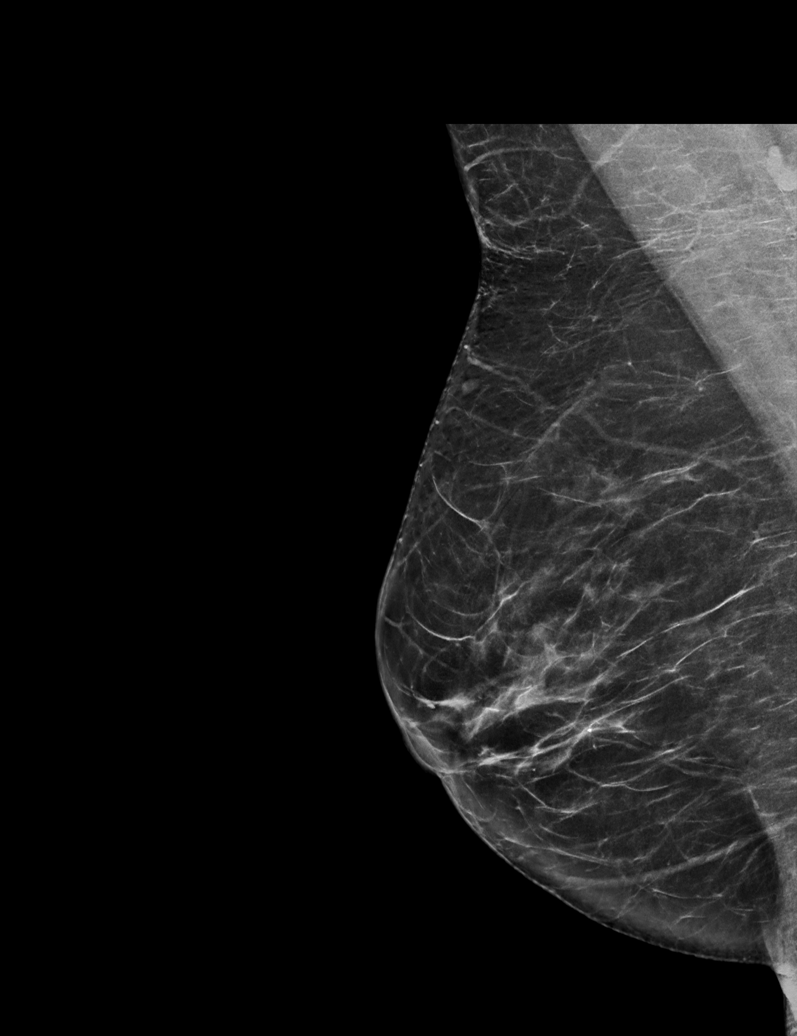

[L MLO synth-2D]
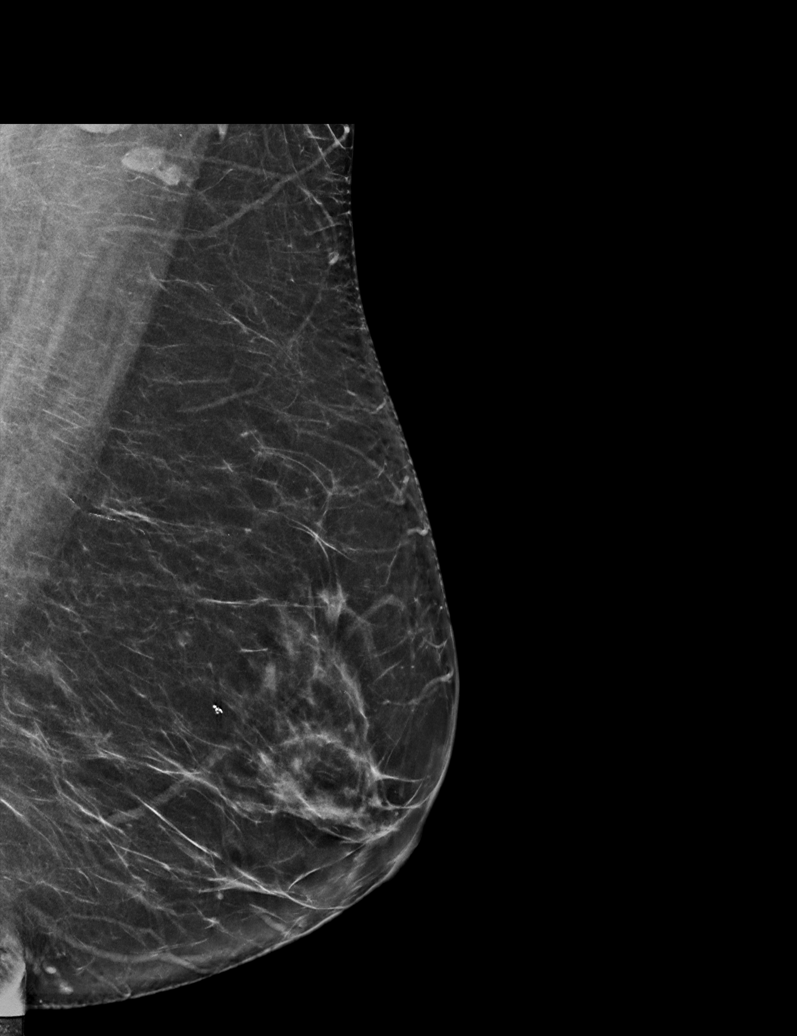

[6 of 36 positions shown; findings below may reference images not displayed]

ACR Breast Density Category b: There are scattered areas of
fibroglandular density.
FINDINGS: There are no findings suspicious for malignancy. Images were
processed with CAD.
IMPRESSION: No mammographic evidence of malignancy. A result letter of this
screening mammogram will be mailed directly to the patient.

RECOMMENDATION:
Screening mammogram in one year. (Code:[id number])

BI-RADS CATEGORY  1: Negative.

## 2022-07-18 ENCOUNTER — Ambulatory Visit
Admission: RE | Admit: 2022-07-18 | Discharge: 2022-07-18 | Disposition: A | Discharge status: Home / Self Care | Payer: Medicare HMO | Source: Ambulatory Visit | Attending: Family Medicine | Admitting: Family Medicine

## 2022-07-18 DIAGNOSIS — Z1231 Encounter for screening mammogram for malignant neoplasm of breast: Secondary | ICD-10-CM | POA: Diagnosis not present

## 2022-07-21 ENCOUNTER — Other Ambulatory Visit: Payer: Self-pay | Admitting: Family Medicine

## 2022-07-21 DIAGNOSIS — R928 Other abnormal and inconclusive findings on diagnostic imaging of breast: Secondary | ICD-10-CM

## 2022-08-05 ENCOUNTER — Other Ambulatory Visit: Payer: Medicare HMO

## 2022-08-25 ENCOUNTER — Other Ambulatory Visit: Payer: Self-pay | Admitting: Family Medicine

## 2022-08-25 ENCOUNTER — Ambulatory Visit
Admission: RE | Admit: 2022-08-25 | Discharge: 2022-08-25 | Disposition: A | Discharge status: Home / Self Care | Payer: Medicare HMO | Source: Ambulatory Visit | Attending: Family Medicine | Admitting: Family Medicine

## 2022-08-25 DIAGNOSIS — N6489 Other specified disorders of breast: Secondary | ICD-10-CM | POA: Diagnosis not present

## 2022-08-25 DIAGNOSIS — R928 Other abnormal and inconclusive findings on diagnostic imaging of breast: Secondary | ICD-10-CM

## 2022-09-29 DIAGNOSIS — H5203 Hypermetropia, bilateral: Secondary | ICD-10-CM | POA: Diagnosis not present

## 2022-12-31 DIAGNOSIS — R69 Illness, unspecified: Secondary | ICD-10-CM | POA: Diagnosis not present

## 2023-01-07 DIAGNOSIS — R69 Illness, unspecified: Secondary | ICD-10-CM | POA: Diagnosis not present

## 2023-01-10 DIAGNOSIS — R69 Illness, unspecified: Secondary | ICD-10-CM | POA: Diagnosis not present

## 2023-01-15 DIAGNOSIS — Z0100 Encounter for examination of eyes and vision without abnormal findings: Secondary | ICD-10-CM | POA: Diagnosis not present

## 2023-01-19 DIAGNOSIS — R69 Illness, unspecified: Secondary | ICD-10-CM | POA: Diagnosis not present

## 2023-01-26 DIAGNOSIS — R69 Illness, unspecified: Secondary | ICD-10-CM | POA: Diagnosis not present

## 2023-01-26 DIAGNOSIS — E785 Hyperlipidemia, unspecified: Secondary | ICD-10-CM | POA: Diagnosis not present

## 2023-01-26 DIAGNOSIS — M810 Age-related osteoporosis without current pathological fracture: Secondary | ICD-10-CM | POA: Diagnosis not present

## 2023-01-26 DIAGNOSIS — Z23 Encounter for immunization: Secondary | ICD-10-CM | POA: Diagnosis not present

## 2023-01-26 DIAGNOSIS — Z0000 Encounter for general adult medical examination without abnormal findings: Secondary | ICD-10-CM | POA: Diagnosis not present

## 2023-01-26 DIAGNOSIS — Z1331 Encounter for screening for depression: Secondary | ICD-10-CM | POA: Diagnosis not present

## 2023-01-27 ENCOUNTER — Other Ambulatory Visit: Payer: Self-pay | Admitting: Family Medicine

## 2023-01-27 DIAGNOSIS — M810 Age-related osteoporosis without current pathological fracture: Secondary | ICD-10-CM

## 2023-01-27 DIAGNOSIS — E2839 Other primary ovarian failure: Secondary | ICD-10-CM

## 2023-02-02 DIAGNOSIS — R69 Illness, unspecified: Secondary | ICD-10-CM | POA: Diagnosis not present

## 2023-02-09 DIAGNOSIS — R69 Illness, unspecified: Secondary | ICD-10-CM | POA: Diagnosis not present

## 2023-02-16 DIAGNOSIS — R69 Illness, unspecified: Secondary | ICD-10-CM | POA: Diagnosis not present

## 2023-02-19 ENCOUNTER — Encounter: Payer: Self-pay | Admitting: Family Medicine

## 2023-02-23 DIAGNOSIS — R69 Illness, unspecified: Secondary | ICD-10-CM | POA: Diagnosis not present

## 2023-02-25 ENCOUNTER — Ambulatory Visit
Admission: RE | Admit: 2023-02-25 | Discharge: 2023-02-25 | Disposition: A | Discharge status: Home / Self Care | Payer: Medicare HMO | Source: Ambulatory Visit | Attending: Family Medicine | Admitting: Family Medicine

## 2023-02-25 DIAGNOSIS — R928 Other abnormal and inconclusive findings on diagnostic imaging of breast: Secondary | ICD-10-CM | POA: Diagnosis not present

## 2023-02-25 DIAGNOSIS — N6489 Other specified disorders of breast: Secondary | ICD-10-CM

## 2023-03-02 DIAGNOSIS — R69 Illness, unspecified: Secondary | ICD-10-CM | POA: Diagnosis not present

## 2023-03-05 ENCOUNTER — Other Ambulatory Visit: Payer: Self-pay | Admitting: Family Medicine

## 2023-03-05 DIAGNOSIS — Z1231 Encounter for screening mammogram for malignant neoplasm of breast: Secondary | ICD-10-CM

## 2023-03-16 DIAGNOSIS — R69 Illness, unspecified: Secondary | ICD-10-CM | POA: Diagnosis not present

## 2023-03-30 DIAGNOSIS — R69 Illness, unspecified: Secondary | ICD-10-CM | POA: Diagnosis not present

## 2023-04-01 DIAGNOSIS — Z0100 Encounter for examination of eyes and vision without abnormal findings: Secondary | ICD-10-CM | POA: Diagnosis not present

## 2023-04-02 DIAGNOSIS — R399 Unspecified symptoms and signs involving the genitourinary system: Secondary | ICD-10-CM | POA: Diagnosis not present

## 2023-04-02 DIAGNOSIS — N390 Urinary tract infection, site not specified: Secondary | ICD-10-CM | POA: Diagnosis not present

## 2023-04-06 DIAGNOSIS — R69 Illness, unspecified: Secondary | ICD-10-CM | POA: Diagnosis not present

## 2023-04-22 DIAGNOSIS — R69 Illness, unspecified: Secondary | ICD-10-CM | POA: Diagnosis not present

## 2023-05-04 DIAGNOSIS — R69 Illness, unspecified: Secondary | ICD-10-CM | POA: Diagnosis not present

## 2023-05-07 DIAGNOSIS — Z87440 Personal history of urinary (tract) infections: Secondary | ICD-10-CM | POA: Diagnosis not present

## 2023-05-07 DIAGNOSIS — Z1330 Encounter for screening examination for mental health and behavioral disorders, unspecified: Secondary | ICD-10-CM | POA: Diagnosis not present

## 2023-05-08 DIAGNOSIS — R69 Illness, unspecified: Secondary | ICD-10-CM | POA: Diagnosis not present

## 2023-05-11 DIAGNOSIS — R69 Illness, unspecified: Secondary | ICD-10-CM | POA: Diagnosis not present

## 2023-05-13 DIAGNOSIS — L821 Other seborrheic keratosis: Secondary | ICD-10-CM | POA: Diagnosis not present

## 2023-05-18 DIAGNOSIS — R69 Illness, unspecified: Secondary | ICD-10-CM | POA: Diagnosis not present

## 2023-07-27 DIAGNOSIS — R69 Illness, unspecified: Secondary | ICD-10-CM | POA: Diagnosis not present

## 2023-08-07 DIAGNOSIS — H903 Sensorineural hearing loss, bilateral: Secondary | ICD-10-CM | POA: Diagnosis not present

## 2023-08-10 DIAGNOSIS — R0982 Postnasal drip: Secondary | ICD-10-CM | POA: Diagnosis not present

## 2023-08-10 DIAGNOSIS — R69 Illness, unspecified: Secondary | ICD-10-CM | POA: Diagnosis not present

## 2023-08-10 DIAGNOSIS — H903 Sensorineural hearing loss, bilateral: Secondary | ICD-10-CM | POA: Diagnosis not present

## 2023-08-10 DIAGNOSIS — K219 Gastro-esophageal reflux disease without esophagitis: Secondary | ICD-10-CM | POA: Diagnosis not present

## 2023-08-10 DIAGNOSIS — Z8669 Personal history of other diseases of the nervous system and sense organs: Secondary | ICD-10-CM | POA: Diagnosis not present

## 2023-08-17 DIAGNOSIS — R69 Illness, unspecified: Secondary | ICD-10-CM | POA: Diagnosis not present

## 2023-08-31 ENCOUNTER — Ambulatory Visit
Admission: RE | Admit: 2023-08-31 | Discharge: 2023-08-31 | Disposition: A | Discharge status: Home / Self Care | Payer: Medicare HMO | Source: Ambulatory Visit | Attending: Family Medicine | Admitting: Family Medicine

## 2023-08-31 DIAGNOSIS — Z1231 Encounter for screening mammogram for malignant neoplasm of breast: Secondary | ICD-10-CM

## 2023-08-31 DIAGNOSIS — E2839 Other primary ovarian failure: Secondary | ICD-10-CM

## 2023-08-31 DIAGNOSIS — R69 Illness, unspecified: Secondary | ICD-10-CM | POA: Diagnosis not present

## 2023-08-31 DIAGNOSIS — M810 Age-related osteoporosis without current pathological fracture: Secondary | ICD-10-CM

## 2023-08-31 DIAGNOSIS — M8588 Other specified disorders of bone density and structure, other site: Secondary | ICD-10-CM | POA: Diagnosis not present

## 2023-08-31 DIAGNOSIS — N958 Other specified menopausal and perimenopausal disorders: Secondary | ICD-10-CM | POA: Diagnosis not present

## 2023-08-31 DIAGNOSIS — E349 Endocrine disorder, unspecified: Secondary | ICD-10-CM | POA: Diagnosis not present

## 2023-09-07 DIAGNOSIS — R69 Illness, unspecified: Secondary | ICD-10-CM | POA: Diagnosis not present

## 2023-09-14 DIAGNOSIS — R69 Illness, unspecified: Secondary | ICD-10-CM | POA: Diagnosis not present

## 2023-09-16 DIAGNOSIS — Z87440 Personal history of urinary (tract) infections: Secondary | ICD-10-CM | POA: Diagnosis not present

## 2023-09-16 DIAGNOSIS — R8290 Unspecified abnormal findings in urine: Secondary | ICD-10-CM | POA: Diagnosis not present

## 2023-09-17 DIAGNOSIS — Z1283 Encounter for screening for malignant neoplasm of skin: Secondary | ICD-10-CM | POA: Diagnosis not present

## 2023-09-17 DIAGNOSIS — D225 Melanocytic nevi of trunk: Secondary | ICD-10-CM | POA: Diagnosis not present

## 2023-09-21 DIAGNOSIS — R69 Illness, unspecified: Secondary | ICD-10-CM | POA: Diagnosis not present

## 2023-09-28 DIAGNOSIS — R69 Illness, unspecified: Secondary | ICD-10-CM | POA: Diagnosis not present

## 2023-10-08 DIAGNOSIS — Z01419 Encounter for gynecological examination (general) (routine) without abnormal findings: Secondary | ICD-10-CM | POA: Diagnosis not present

## 2023-10-08 DIAGNOSIS — C541 Malignant neoplasm of endometrium: Secondary | ICD-10-CM | POA: Diagnosis not present

## 2023-10-22 DIAGNOSIS — N898 Other specified noninflammatory disorders of vagina: Secondary | ICD-10-CM | POA: Diagnosis not present

## 2023-10-22 DIAGNOSIS — L900 Lichen sclerosus et atrophicus: Secondary | ICD-10-CM | POA: Diagnosis not present

## 2023-10-23 DIAGNOSIS — N898 Other specified noninflammatory disorders of vagina: Secondary | ICD-10-CM | POA: Diagnosis not present

## 2023-11-13 DIAGNOSIS — Z860101 Personal history of adenomatous and serrated colon polyps: Secondary | ICD-10-CM | POA: Diagnosis not present

## 2023-11-13 DIAGNOSIS — D123 Benign neoplasm of transverse colon: Secondary | ICD-10-CM | POA: Diagnosis not present

## 2023-11-13 DIAGNOSIS — Z09 Encounter for follow-up examination after completed treatment for conditions other than malignant neoplasm: Secondary | ICD-10-CM | POA: Diagnosis not present

## 2023-11-13 DIAGNOSIS — K5730 Diverticulosis of large intestine without perforation or abscess without bleeding: Secondary | ICD-10-CM | POA: Diagnosis not present

## 2023-11-14 DIAGNOSIS — R69 Illness, unspecified: Secondary | ICD-10-CM | POA: Diagnosis not present

## 2023-11-17 DIAGNOSIS — D123 Benign neoplasm of transverse colon: Secondary | ICD-10-CM | POA: Diagnosis not present

## 2023-11-18 DIAGNOSIS — H47011 Ischemic optic neuropathy, right eye: Secondary | ICD-10-CM | POA: Diagnosis not present

## 2024-03-03 DIAGNOSIS — N390 Urinary tract infection, site not specified: Secondary | ICD-10-CM | POA: Diagnosis not present

## 2024-03-03 DIAGNOSIS — Z0000 Encounter for general adult medical examination without abnormal findings: Secondary | ICD-10-CM | POA: Diagnosis not present

## 2024-03-03 DIAGNOSIS — E785 Hyperlipidemia, unspecified: Secondary | ICD-10-CM | POA: Diagnosis not present

## 2024-03-03 DIAGNOSIS — Z8542 Personal history of malignant neoplasm of other parts of uterus: Secondary | ICD-10-CM | POA: Diagnosis not present

## 2024-03-03 DIAGNOSIS — Z1331 Encounter for screening for depression: Secondary | ICD-10-CM | POA: Diagnosis not present

## 2024-03-22 DIAGNOSIS — Z0100 Encounter for examination of eyes and vision without abnormal findings: Secondary | ICD-10-CM | POA: Diagnosis not present

## 2024-03-23 DIAGNOSIS — N390 Urinary tract infection, site not specified: Secondary | ICD-10-CM | POA: Diagnosis not present

## 2024-03-23 DIAGNOSIS — R310 Gross hematuria: Secondary | ICD-10-CM | POA: Diagnosis not present

## 2024-03-23 DIAGNOSIS — N3281 Overactive bladder: Secondary | ICD-10-CM | POA: Diagnosis not present

## 2024-04-04 DIAGNOSIS — N390 Urinary tract infection, site not specified: Secondary | ICD-10-CM | POA: Diagnosis not present

## 2024-04-04 DIAGNOSIS — N1330 Unspecified hydronephrosis: Secondary | ICD-10-CM | POA: Diagnosis not present

## 2024-04-04 DIAGNOSIS — R310 Gross hematuria: Secondary | ICD-10-CM | POA: Diagnosis not present

## 2024-04-04 DIAGNOSIS — K5730 Diverticulosis of large intestine without perforation or abscess without bleeding: Secondary | ICD-10-CM | POA: Diagnosis not present

## 2024-04-05 DIAGNOSIS — R030 Elevated blood-pressure reading, without diagnosis of hypertension: Secondary | ICD-10-CM | POA: Diagnosis not present

## 2024-04-05 DIAGNOSIS — G4733 Obstructive sleep apnea (adult) (pediatric): Secondary | ICD-10-CM | POA: Diagnosis not present

## 2024-06-06 ENCOUNTER — Other Ambulatory Visit: Payer: Self-pay | Admitting: Family Medicine

## 2024-06-06 DIAGNOSIS — Z1231 Encounter for screening mammogram for malignant neoplasm of breast: Secondary | ICD-10-CM

## 2024-09-14 ENCOUNTER — Other Ambulatory Visit: Payer: Self-pay | Admitting: Medical Genetics

## 2024-09-16 DIAGNOSIS — D225 Melanocytic nevi of trunk: Secondary | ICD-10-CM | POA: Diagnosis not present

## 2024-09-16 DIAGNOSIS — Z1283 Encounter for screening for malignant neoplasm of skin: Secondary | ICD-10-CM | POA: Diagnosis not present

## 2024-09-16 DIAGNOSIS — L650 Telogen effluvium: Secondary | ICD-10-CM | POA: Diagnosis not present

## 2024-09-21 ENCOUNTER — Ambulatory Visit
Admission: RE | Admit: 2024-09-21 | Discharge: 2024-09-21 | Disposition: A | Discharge status: Home / Self Care | Source: Ambulatory Visit | Attending: Family Medicine | Admitting: Family Medicine

## 2024-09-21 DIAGNOSIS — Z1231 Encounter for screening mammogram for malignant neoplasm of breast: Secondary | ICD-10-CM | POA: Diagnosis not present

## 2024-09-26 ENCOUNTER — Other Ambulatory Visit: Payer: Self-pay | Admitting: Family Medicine

## 2024-09-26 DIAGNOSIS — R928 Other abnormal and inconclusive findings on diagnostic imaging of breast: Secondary | ICD-10-CM

## 2024-10-04 ENCOUNTER — Ambulatory Visit
Admission: RE | Admit: 2024-10-04 | Discharge: 2024-10-04 | Disposition: A | Discharge status: Home / Self Care | Source: Ambulatory Visit | Attending: Family Medicine | Admitting: Family Medicine

## 2024-10-04 ENCOUNTER — Ambulatory Visit

## 2024-10-04 DIAGNOSIS — R928 Other abnormal and inconclusive findings on diagnostic imaging of breast: Secondary | ICD-10-CM | POA: Diagnosis not present

## 2024-10-04 DIAGNOSIS — R92322 Mammographic fibroglandular density, left breast: Secondary | ICD-10-CM | POA: Diagnosis not present

## 2024-10-19 ENCOUNTER — Other Ambulatory Visit

## 2024-11-03 ENCOUNTER — Other Ambulatory Visit

## 2024-11-03 DIAGNOSIS — Z006 Encounter for examination for normal comparison and control in clinical research program: Secondary | ICD-10-CM

## 2024-11-14 DIAGNOSIS — H47011 Ischemic optic neuropathy, right eye: Secondary | ICD-10-CM | POA: Diagnosis not present

## 2024-11-16 DIAGNOSIS — Z01419 Encounter for gynecological examination (general) (routine) without abnormal findings: Secondary | ICD-10-CM | POA: Diagnosis not present

## 2024-11-16 DIAGNOSIS — Z124 Encounter for screening for malignant neoplasm of cervix: Secondary | ICD-10-CM | POA: Diagnosis not present

## 2024-11-18 LAB — GENECONNECT MOLECULAR SCREEN: Genetic Analysis Overall Interpretation: NEGATIVE
